# Patient Record
Sex: Male | Born: 1965 | Race: Black or African American | Hispanic: No | Marital: Single | Smoking: Current every day smoker
Health system: Southern US, Community
[De-identification: ages and names within clinical notes are randomized; demographics above are authoritative.]

---

## 2011-12-22 ENCOUNTER — Ambulatory Visit (INDEPENDENT_AMBULATORY_CARE_PROVIDER_SITE_OTHER): Payer: 59 | Admitting: Emergency Medicine

## 2011-12-22 VITALS — BP 120/74 | HR 61 | Temp 97.9°F | Resp 16 | Ht 69.5 in | Wt 192.0 lb

## 2011-12-22 DIAGNOSIS — Z0000 Encounter for general adult medical examination without abnormal findings: Secondary | ICD-10-CM

## 2011-12-22 DIAGNOSIS — Z720 Tobacco use: Secondary | ICD-10-CM

## 2011-12-22 MED ORDER — VARENICLINE TARTRATE 0.5 MG PO TABS
0.5000 mg | ORAL_TABLET | Freq: Two times a day (BID) | ORAL | Status: AC
Start: 2011-12-22 — End: 2012-03-21

## 2011-12-22 MED ORDER — VARENICLINE TARTRATE 1 MG PO TABS: 1.0000 mg | ORAL_TABLET | Freq: Two times a day (BID) | ORAL | Status: AC

## 2011-12-22 NOTE — Progress Notes (Signed)
  Subjective:    Patient ID: Jake Oconnell, male    DOB: 1966-06-16, 46 y.o.   MRN: [id number]  Other Pertinent negatives include no abdominal pain, anorexia, arthralgias, change in bowel habit, chest pain, chills, congestion, coughing, diaphoresis, fatigue, fever, headaches, joint swelling, myalgias, nausea, neck pain, numbness, rash, sore throat, swollen glands, urinary symptoms, vertigo, visual change, vomiting or weakness.      Review of Systems  Constitutional: Negative for fever, chills, diaphoresis and fatigue.  HENT: Negative.  Negative for congestion, sore throat and neck pain.   Eyes: Negative.   Respiratory: Negative.  Negative for cough.   Cardiovascular: Negative.  Negative for chest pain.  Gastrointestinal: Negative.  Negative for nausea, vomiting, abdominal pain, anorexia and change in bowel habit.  Genitourinary: Negative.   Musculoskeletal: Negative for myalgias, joint swelling and arthralgias.  Skin: Negative for rash.  Neurological: Negative for vertigo, weakness, numbness and headaches.  Hematological: Negative.   All other systems reviewed and are negative.       Objective:   Physical Exam  Nursing note and vitals reviewed. Constitutional: He is oriented to person, place, and time. He appears well-developed and well-nourished.  HENT:  Head: Normocephalic and atraumatic.  Right Ear: External ear normal.  Left Ear: External ear normal.  Nose: Nose normal.  Mouth/Throat: Oropharynx is clear and moist.  Eyes: Conjunctivae are normal. Pupils are equal, round, and reactive to light.  Neck: Normal range of motion. Neck supple. No tracheal deviation present. No thyromegaly present.  Cardiovascular: Normal rate, regular rhythm and normal heart sounds.   Pulmonary/Chest: Effort normal and breath sounds normal.  Abdominal: Soft. He exhibits no mass. There is no tenderness.  Musculoskeletal: Normal range of motion.  Lymphadenopathy:    He has no cervical adenopathy.    Neurological: He is alert and oriented to person, place, and time. He exhibits normal muscle tone. Coordination normal.  Skin: Skin is warm and dry.          Assessment & Plan:  To come in tomorrow fasting for labs.

## 2011-12-23 ENCOUNTER — Ambulatory Visit (INDEPENDENT_AMBULATORY_CARE_PROVIDER_SITE_OTHER): Payer: 59 | Admitting: Physician Assistant

## 2011-12-23 VITALS — BP 117/78 | HR 66 | Temp 97.8°F | Resp 16 | Ht 70.0 in | Wt 190.0 lb

## 2011-12-23 DIAGNOSIS — Z0000 Encounter for general adult medical examination without abnormal findings: Secondary | ICD-10-CM

## 2011-12-23 LAB — LIPID PANEL
Cholesterol: 163 mg/dL (ref 0–200)
HDL: 41 mg/dL (ref >39)
Total CHOL/HDL Ratio: 4 Ratio
Triglycerides: 194 mg/dL — ABNORMAL HIGH (ref <150)
VLDL: 39 mg/dL (ref 0–40)

## 2011-12-23 LAB — COMPREHENSIVE METABOLIC PANEL
Alkaline Phosphatase: 56 U/L (ref 39–117)
CO2: 28 mEq/L (ref 19–32)
Creat: 0.77 mg/dL (ref 0.50–1.35)
Glucose, Bld: 89 mg/dL (ref 70–99)
Sodium: 142 mEq/L (ref 135–145)
Total Bilirubin: 0.9 mg/dL (ref 0.3–1.2)
Total Protein: 7.2 g/dL (ref 6.0–8.3)

## 2011-12-23 LAB — POCT URINALYSIS DIPSTICK
Bilirubin, UA: NEGATIVE
Ketones, UA: NEGATIVE
Leukocytes, UA: NEGATIVE
Nitrite, UA: NEGATIVE
Protein, UA: NEGATIVE

## 2011-12-23 LAB — POCT CBC
Granulocyte percent: 37.7 %G (ref 37–80)
HCT, POC: 41.2 % — AB (ref 43.5–53.7)
Hemoglobin: 13.3 g/dL — AB (ref 14.1–18.1)
MPV: 9 fL (ref 0–99.8)
POC Granulocyte: 2.2 (ref 2–6.9)
POC MID %: 10.5 %M (ref 0–12)

## 2011-12-23 LAB — PSA: PSA: 0.4 ng/mL (ref <=4.00)

## 2011-12-24 LAB — VITAMIN D 25 HYDROXY (VIT D DEFICIENCY, FRACTURES): Vit D, 25-Hydroxy: 16 ng/mL — ABNORMAL LOW (ref 30–89)

## 2011-12-27 ENCOUNTER — Other Ambulatory Visit: Payer: Self-pay | Admitting: Emergency Medicine

## 2011-12-27 ENCOUNTER — Encounter: Payer: Self-pay | Admitting: Emergency Medicine

## 2011-12-27 ENCOUNTER — Telehealth: Payer: Self-pay | Admitting: *Deleted

## 2011-12-27 MED ORDER — VITAMIN D (ERGOCALCIFEROL) 1.25 MG (50000 UNIT) PO CAPS: 50000.0000 [IU] | ORAL_CAPSULE | ORAL | Status: DC

## 2012-03-30 NOTE — Progress Notes (Signed)
Lab only 

## 2013-01-04 NOTE — Telephone Encounter (Signed)
Phone call

## 2013-01-31 ENCOUNTER — Ambulatory Visit (INDEPENDENT_AMBULATORY_CARE_PROVIDER_SITE_OTHER): Payer: Commercial Managed Care - PPO | Admitting: Emergency Medicine

## 2013-01-31 VITALS — BP 116/78 | HR 56 | Temp 97.5°F | Resp 20 | Ht 69.0 in | Wt 184.6 lb

## 2013-01-31 DIAGNOSIS — R079 Chest pain, unspecified: Secondary | ICD-10-CM

## 2013-01-31 DIAGNOSIS — Z0000 Encounter for general adult medical examination without abnormal findings: Secondary | ICD-10-CM

## 2013-01-31 LAB — POCT CBC
Hemoglobin: 13.7 g/dL — AB (ref 14.1–18.1)
Lymph, poc: 2.6 (ref 0.6–3.4)
MCH, POC: 30.6 pg (ref 27–31.2)
MCHC: 31.9 g/dL (ref 31.8–35.4)
MID (cbc): 0.5 (ref 0–0.9)
MPV: 9.4 fL (ref 0–99.8)
POC Granulocyte: 1.7 — AB (ref 2–6.9)
POC MID %: 10.5 %M (ref 0–12)
Platelet Count, POC: 170 10*3/uL (ref 142–424)
RDW, POC: 12.2 %
WBC: 4.8 10*3/uL (ref 4.6–10.2)

## 2013-01-31 LAB — POCT URINALYSIS DIPSTICK
Bilirubin, UA: NEGATIVE
Blood, UA: NEGATIVE
Leukocytes, UA: NEGATIVE
Nitrite, UA: NEGATIVE
Protein, UA: NEGATIVE
Urobilinogen, UA: 0.2
pH, UA: 5.5

## 2013-01-31 MED ORDER — VITAMIN D (ERGOCALCIFEROL) 1.25 MG (50000 UNIT) PO CAPS: 50000.0000 [IU] | ORAL_CAPSULE | ORAL | Status: DC

## 2013-01-31 NOTE — Patient Instructions (Addendum)
Chest Pain (Nonspecific) °It is often hard to give a specific diagnosis for the cause of chest pain. There is always a chance that your pain could be related to something serious, such as a heart attack or a blood clot in the lungs. You need to follow up with your caregiver for further evaluation. °CAUSES  °· Heartburn. °· Pneumonia or bronchitis. °· Anxiety or stress. °· Inflammation around your heart (pericarditis) or lung (pleuritis or pleurisy). °· A blood clot in the lung. °· A collapsed lung (pneumothorax). It can develop suddenly on its own (spontaneous pneumothorax) or from injury (trauma) to the chest. °· Shingles infection (herpes zoster virus). °The chest wall is composed of bones, muscles, and cartilage. Any of these can be the source of the pain. °· The bones can be bruised by injury. °· The muscles or cartilage can be strained by coughing or overwork. °· The cartilage can be affected by inflammation and become sore (costochondritis). °DIAGNOSIS  °Lab tests or other studies, such as X-rays, electrocardiography, stress testing, or cardiac imaging, may be needed to find the cause of your pain.  °TREATMENT  °· Treatment depends on what may be causing your chest pain. Treatment may include: °· Acid blockers for heartburn. °· Anti-inflammatory medicine. °· Pain medicine for inflammatory conditions. °· Antibiotics if an infection is present. °· You may be advised to change lifestyle habits. This includes stopping smoking and avoiding alcohol, caffeine, and chocolate. °· You may be advised to keep your head raised (elevated) when sleeping. This reduces the chance of acid going backward from your stomach into your esophagus. °· Most of the time, nonspecific chest pain will improve within 2 to 3 days with rest and mild pain medicine. °HOME CARE INSTRUCTIONS  °· If antibiotics were prescribed, take your antibiotics as directed. Finish them even if you start to feel better. °· For the next few days, avoid physical  activities that bring on chest pain. Continue physical activities as directed. °· Do not smoke. °· Avoid drinking alcohol. °· Only take over-the-counter or prescription medicine for pain, discomfort, or fever as directed by your caregiver. °· Follow your caregiver's suggestions for further testing if your chest pain does not go away. °· Keep any follow-up appointments you made. If you do not go to an appointment, you could develop lasting (chronic) problems with pain. If there is any problem keeping an appointment, you must call to reschedule. °SEEK MEDICAL CARE IF:  °· You think you are having problems from the medicine you are taking. Read your medicine instructions carefully. °· Your chest pain does not go away, even after treatment. °· You develop a rash with blisters on your chest. °SEEK IMMEDIATE MEDICAL CARE IF:  °· You have increased chest pain or pain that spreads to your arm, neck, jaw, back, or abdomen. °· You develop shortness of breath, an increasing cough, or you are coughing up blood. °· You have severe back or abdominal pain, feel nauseous, or vomit. °· You develop severe weakness, fainting, or chills. °· You have a fever. °THIS IS AN EMERGENCY. Do not wait to see if the pain will go away. Get medical help at once. Call your local emergency services (911 in [place] Do not drive yourself to the hospital. °MAKE SURE YOU:  °· Understand these instructions. °· Will watch your condition. °· Will get help right away if you are not doing well or get worse. °Document Released: 04/01/2005 Document Revised: 09/14/2011 Document Reviewed: 01/26/2008 °ExitCare® Patient Information ©2014 [employer]   LL[employer]. ° °

## 2013-01-31 NOTE — Progress Notes (Addendum)
[facility] [address], [place] [address] [phone]  Date:  01/31/2013   Name:  Jake Oconnell   DOB:  23-Nov-1965   MRN:  [id number]  PCP:  No PCP Per Patient    Chief Complaint: Annual Exam, Muscle Pain, Chest Pain and Palpitations   History of Present Illness:  Jake Oconnell is a 47 y.o. very pleasant male patient who presents with the following:  Works in a warehouse.  From [place].  Has intermittent pain in chest.  Non radiating.  Sometimes lasts all day.  No associated radiation of pain, shortness of breath, nausea or vomiting. Some palpitations and sensation of a rapid heartbeat.  Chest pain lasts variable times.  No associated injury.  Pain can be short lived.  Other times has has cramps and pains in arms associated with lifting.  No back pain, weakness.  No improvement with over the counter medications or other home remedies. Denies other complaint or health concern today.   No meds or allergies.  Occasional smoker.  There are no active problems to display for this patient.   History reviewed. No pertinent past medical history.  History reviewed. No pertinent past surgical history.  History  Substance Use Topics  . Smoking status: Current Every Day Smoker  . Smokeless tobacco: Not on file  . Alcohol Use: No    History reviewed. No pertinent family history.  No Known Allergies  Medication list has been reviewed and updated.  Current Outpatient Prescriptions on File Prior to Visit  Medication Sig Dispense Refill  . ibuprofen (ADVIL,MOTRIN) 200 MG tablet Take 200 mg by mouth every 6 (six) hours as needed.      . Vitamin D, Ergocalciferol, (DRISDOL) 50000 UNITS CAPS Take 1 capsule (50,000 Units total) by mouth every 7 (seven) days.  12 capsule  3   No current facility-administered medications on file prior to visit.    Review of Systems:  As per HPI, otherwise negative.    Physical Examination: Filed Vitals:   01/31/13 1349  BP: 116/78   Pulse: 56  Temp: 97.5 F (36.4 C)  Resp: 20   Filed Vitals:   01/31/13 1349  Height: 5\' 9"  (1.753 m)  Weight: 184 lb 9.6 oz (83.734 kg)   Body mass index is 27.25 kg/(m^2). Ideal Body Weight: Weight in (lb) to have BMI = 25: 168.9  GEN: WDWN, NAD, Non-toxic, A & O x 3 HEENT: Atraumatic, Normocephalic. Neck supple. No masses, No LAD. Ears and Nose: No external deformity. CV: RRR, No M/G/R. No JVD. No thrill. No extra heart sounds. PULM: CTA B, no wheezes, crackles, rhonchi. No retractions. No resp. distress. No accessory muscle use. ABD: S, NT, ND, +BS. No rebound. No HSM. EXTR: No c/c/e NEURO Normal gait.  PSYCH: Normally interactive. Conversant. Not depressed or anxious appearing.  Calm demeanor.    Assessment and Plan: Chest pain, palpitations Muscle pain Labs Cardiology consultation   Signed,  Phillips Odor, MD   Results for orders placed in visit on 01/31/13  COMPREHENSIVE METABOLIC PANEL      Result Value Range   Sodium 141  135 - 145 mEq/L   Potassium 4.4  3.5 - 5.3 mEq/L   Chloride 105  96 - 112 mEq/L   CO2 29  19 - 32 mEq/L   Glucose, Bld 94  70 - 99 mg/dL   BUN 10  6 - 23 mg/dL   Creat 2.95  6.21 - 3.08 mg/dL   Total  Bilirubin 0.7  0.3 - 1.2 mg/dL   Alkaline Phosphatase 58  39 - 117 U/L   AST 35  0 - 37 U/L   ALT 69 (*) 0 - 53 U/L   Total Protein 7.4  6.0 - 8.3 g/dL   Albumin 4.5  3.5 - 5.2 g/dL   Calcium 9.5  8.4 - 16.1 mg/dL  LIPID PANEL      Result Value Range   Cholesterol 203 (*) 0 - 200 mg/dL   Triglycerides 096 (*) <150 mg/dL   HDL 46  >04 mg/dL   Total CHOL/HDL Ratio 4.4     VLDL 38  0 - 40 mg/dL   LDL Cholesterol 540 (*) 0 - 99 mg/dL  CK      Result Value Range   Total CK 442 (*) 7 - 232 U/L  TSH      Result Value Range   TSH 0.548  0.350 - 4.500 uIU/mL  PSA      Result Value Range   PSA 0.38  <=4.00 ng/mL  POCT CBC      Result Value Range   WBC 4.8  4.6 - 10.2 K/uL   Lymph, poc 2.6  0.6 - 3.4   POC LYMPH PERCENT 54.3 (*)  10 - 50 %L   MID (cbc) 0.5  0 - 0.9   POC MID % 10.5  0 - 12 %M   POC Granulocyte 1.7 (*) 2 - 6.9   Granulocyte percent 35.2 (*) 37 - 80 %G   RBC 4.47 (*) 4.69 - 6.13 M/uL   Hemoglobin 13.7 (*) 14.1 - 18.1 g/dL   HCT, POC 98.1 (*) 19.1 - 53.7 %   MCV 96.0  80 - 97 fL   MCH, POC 30.6  27 - 31.2 pg   MCHC 31.9  31.8 - 35.4 g/dL   RDW, POC 47.8     Platelet Count, POC 170  142 - 424 K/uL   MPV 9.4  0 - 99.8 fL  POCT URINALYSIS DIPSTICK      Result Value Range   Color, UA amber     Clarity, UA clear     Glucose, UA neg     Bilirubin, UA neg     Ketones, UA neg     Spec Grav, UA 1.025     Blood, UA neg     pH, UA 5.5     Protein, UA neg     Urobilinogen, UA 0.2     Nitrite, UA neg     Leukocytes, UA Negative

## 2013-02-01 LAB — LIPID PANEL
Cholesterol: 203 mg/dL — ABNORMAL HIGH (ref 0–200)
LDL Cholesterol: 119 mg/dL — ABNORMAL HIGH (ref 0–99)
Triglycerides: 188 mg/dL — ABNORMAL HIGH (ref <150)
VLDL: 38 mg/dL (ref 0–40)

## 2013-02-01 LAB — COMPREHENSIVE METABOLIC PANEL
ALT: 69 U/L — ABNORMAL HIGH (ref 0–53)
Albumin: 4.5 g/dL (ref 3.5–5.2)
CO2: 29 mEq/L (ref 19–32)
Glucose, Bld: 94 mg/dL (ref 70–99)
Potassium: 4.4 mEq/L (ref 3.5–5.3)
Sodium: 141 mEq/L (ref 135–145)
Total Bilirubin: 0.7 mg/dL (ref 0.3–1.2)
Total Protein: 7.4 g/dL (ref 6.0–8.3)

## 2013-02-01 LAB — PSA: PSA: 0.38 ng/mL (ref <=4.00)

## 2013-02-01 LAB — TSH: TSH: 0.548 u[IU]/mL (ref 0.350–4.500)

## 2013-02-01 NOTE — Addendum Note (Signed)
Addended by: Carmelina Dane on: 02/01/2013 07:38 PM   Modules accepted: Orders

## 2013-02-07 ENCOUNTER — Ambulatory Visit (INDEPENDENT_AMBULATORY_CARE_PROVIDER_SITE_OTHER): Payer: Commercial Managed Care - PPO | Admitting: Emergency Medicine

## 2013-02-07 VITALS — BP 108/88 | HR 67 | Temp 98.2°F | Resp 16 | Ht 70.0 in | Wt 190.0 lb

## 2013-02-07 DIAGNOSIS — M6282 Rhabdomyolysis: Secondary | ICD-10-CM

## 2013-02-07 DIAGNOSIS — R079 Chest pain, unspecified: Secondary | ICD-10-CM

## 2013-02-07 DIAGNOSIS — Z0000 Encounter for general adult medical examination without abnormal findings: Secondary | ICD-10-CM

## 2013-02-07 LAB — CK: Total CK: 234 U/L — ABNORMAL HIGH (ref 7–232)

## 2013-02-07 NOTE — Progress Notes (Signed)
[facility] [address] [place] [address] [phone]  Date:  02/07/2013   Name:  Jake Oconnell   DOB:  [birth year]   MRN:  [id number]  PCP:  No PCP Per Patient    Chief Complaint: Follow-up   History of Present Illness:  Jake Oconnell is a 47 y.o. very pleasant male patient who presents with the following:  Seen previously with generalized muscle pains.  Had chest pain associated with the general muscle pain.  No history of injury.   Had elevated CK.  Now says pains gone and feels normal and well.  Work is going well. No recurrent pains.  Denies other complaint or health concern today.   There are no active problems to display for this patient.   No past medical history on file.  No past surgical history on file.  History  Substance Use Topics  . Smoking status: Current Every Day Smoker  . Smokeless tobacco: Not on file  . Alcohol Use: No    No family history on file.  No Known Allergies  Medication list has been reviewed and updated.  Current Outpatient Prescriptions on File Prior to Visit  Medication Sig Dispense Refill  . Vitamin D, Ergocalciferol, (DRISDOL) 50000 UNITS CAPS Take 1 capsule (50,000 Units total) by mouth every 7 (seven) days.  12 capsule  3  . ibuprofen (ADVIL,MOTRIN) 200 MG tablet Take 200 mg by mouth every 6 (six) hours as needed.       No current facility-administered medications on file prior to visit.    Review of Systems:  As per HPI, otherwise negative.    Physical Examination: Filed Vitals:   02/07/13 0847  BP: 108/88  Pulse: 67  Temp: 98.2 F (36.8 C)  Resp: 16   Filed Vitals:   02/07/13 0847  Height: 5\' 10"  (1.778 m)  Weight: 190 lb (86.183 kg)   Body mass index is 27.26 kg/(m^2). Ideal Body Weight: Weight in (lb) to have BMI = 25: 173.9  GEN: WDWN, NAD, Non-toxic, A & O x 3 HEENT: Atraumatic, Normocephalic. Neck supple. No masses, No LAD. Ears and Nose: No external deformity. CV: RRR, No  M/G/R. No JVD. No thrill. No extra heart sounds. PULM: CTA B, no wheezes, crackles, rhonchi. No retractions. No resp. distress. No accessory muscle use. ABD: S, NT, ND, +BS. No rebound. No HSM. EXTR: No c/c/e NEURO Normal gait.  PSYCH: Normally interactive. Conversant. Not depressed or anxious appearing.  Calm demeanor.    Assessment and Plan: Rhabdomyolysis Increase fluids   Signed,  Phillips Odor, MD

## 2013-03-27 ENCOUNTER — Ambulatory Visit: Payer: Self-pay | Admitting: Internal Medicine

## 2013-05-09 ENCOUNTER — Telehealth: Payer: Self-pay

## 2013-05-09 DIAGNOSIS — R748 Abnormal levels of other serum enzymes: Secondary | ICD-10-CM

## 2013-05-09 NOTE — Telephone Encounter (Signed)
PATIENT STATES HE HAS NEVER HEARD ANYTHING BACK FROM HIS PHYSICAL. HE WOULD LIKE TO GET SOME INFORMATION. HE HAS NOT EVER RECEIVED HIS LAB RESULTS EITHER. BEST PHONE [phone] (CELL)   WILL LET us  KNOW THE NAME OF THE PHARMACY WHEN HE IS CALLED BACK.  MBC

## 2013-05-10 NOTE — Telephone Encounter (Signed)
Will you advise on CK? Was elevated, came for recheck, better, but still elevated.

## 2013-05-11 NOTE — Telephone Encounter (Signed)
Labs look good.  Needs to moderate his workouts.  Follow up in one month for repeat CK and CMP

## 2013-05-11 NOTE — Telephone Encounter (Signed)
Called patient to advise, he will come back in 1 month for lab only

## 2013-05-11 NOTE — Telephone Encounter (Signed)
Thanks called left message for him to call me back.

## 2013-07-03 ENCOUNTER — Ambulatory Visit (INDEPENDENT_AMBULATORY_CARE_PROVIDER_SITE_OTHER): Payer: Commercial Managed Care - PPO | Admitting: Family Medicine

## 2013-07-03 VITALS — BP 120/76 | HR 56 | Temp 98.0°F | Resp 16 | Ht 69.0 in | Wt 186.0 lb

## 2013-07-03 DIAGNOSIS — R899 Unspecified abnormal finding in specimens from other organs, systems and tissues: Secondary | ICD-10-CM

## 2013-07-03 DIAGNOSIS — Z23 Encounter for immunization: Secondary | ICD-10-CM

## 2013-07-03 DIAGNOSIS — R6889 Other general symptoms and signs: Secondary | ICD-10-CM

## 2013-07-03 DIAGNOSIS — E559 Vitamin D deficiency, unspecified: Secondary | ICD-10-CM

## 2013-07-03 DIAGNOSIS — R748 Abnormal levels of other serum enzymes: Secondary | ICD-10-CM

## 2013-07-03 LAB — POCT CBC
Granulocyte percent: 38.9 %G (ref 37–80)
HCT, POC: 40.8 % — AB (ref 43.5–53.7)
Hemoglobin: 12.7 g/dL — AB (ref 14.1–18.1)
MCV: 96.7 fL (ref 80–97)
MPV: 9.8 fL (ref 0–99.8)
POC LYMPH PERCENT: 50.7 %L — AB (ref 10–50)
POC MID %: 10.4 %M (ref 0–12)
RBC: 4.22 M/uL — AB (ref 4.69–6.13)
RDW, POC: 12 %

## 2013-07-03 LAB — HEPATIC FUNCTION PANEL
ALT: 24 U/L (ref 0–53)
AST: 23 U/L (ref 0–37)
Alkaline Phosphatase: 56 U/L (ref 39–117)
Bilirubin, Direct: 0.1 mg/dL (ref 0.0–0.3)
Indirect Bilirubin: 0.4 mg/dL (ref 0.0–0.9)

## 2013-07-03 LAB — CK: Total CK: 224 U/L (ref 7–232)

## 2013-07-03 NOTE — Progress Notes (Addendum)
[facility] [address] [place] [address] [phone]  Date:  07/03/2013   Name:  Jake Oconnell   DOB:  05-02-1966   MRN:  [id number]  PCP:  No PCP Per Patient    Chief Complaint: Follow-up   History of Present Illness:  Jake Oconnell is a 47 y.o. very pleasant male patient who presents with the following:  He is here today for follow-up.   He is feeling well.  He was noted to have rhabdo over the summer and would like to follow-up today- he notes that his muscle pains have now gone away.    He is fasting today for labs.    His last tetanus shot was more than 10 years ago.  He would like to have a tetanus shot today.    He is generally health PSA done in July- normal  There are no active problems to display for this patient.   History reviewed. No pertinent past medical history.  History reviewed. No pertinent past surgical history.  History  Substance Use Topics  . Smoking status: Current Every Day Smoker  . Smokeless tobacco: Not on file  . Alcohol Use: No    History reviewed. No pertinent family history.  No Known Allergies  Medication list has been reviewed and updated.  Current Outpatient Prescriptions on File Prior to Visit  Medication Sig Dispense Refill  . ibuprofen (ADVIL,MOTRIN) 200 MG tablet Take 200 mg by mouth every 6 (six) hours as needed.      . Vitamin D, Ergocalciferol, (DRISDOL) 50000 UNITS CAPS Take 1 capsule (50,000 Units total) by mouth every 7 (seven) days.  12 capsule  3   No current facility-administered medications on file prior to visit.    Review of Systems:  As per HPI- otherwise negative.   Physical Examination: Filed Vitals:   07/03/13 0908  BP: 120/76  Pulse: 56  Temp: 98 F (36.7 C)  Resp: 16   Filed Vitals:   07/03/13 0908  Height: 5\' 9"  (1.753 m)  Weight: 186 lb (84.369 kg)   Body mass index is 27.45 kg/(m^2). Ideal Body Weight: Weight in (lb) to have BMI = 25: 168.9  GEN: WDWN,  NAD, Non-toxic, A & O x 3, looks well HEENT: Atraumatic, Normocephalic. Neck supple. No masses, No LAD.  Bilateral TM wnl, oropharynx normal.  PEERL,EOMI.   Ears and Nose: No external deformity. CV: RRR, No M/G/R. No JVD. No thrill. No extra heart sounds. PULM: CTA B, no wheezes, crackles, rhonchi. No retractions. No resp. distress. No accessory muscle use. ABD: S, NT, ND EXTR: No c/c/e NEURO Normal gait.  PSYCH: Normally interactive. Conversant. Not depressed or anxious appearing.  Calm demeanor.  Gu: normal testicular exam, no penile lesions or discharge Prostate normal  Assessment and Plan: Immunization due  Elevated CK - Plan: CK  Abnormal laboratory test - Plan: POCT CBC, Hepatic Function Panel  Unspecified vitamin D deficiency - Plan: Vitamin D, 25-hydroxy  Update tdap today- declined flu shot Otherwise await labs. He is not sure if he needs to continue taking his vit D- will await his repeat level and decide  Signed Abbe Amsterdam, MD  07/06/12: called to go over labs.  No answer- will send letter instead Results for orders placed in visit on 07/03/13  CK      Result Value Range   Total CK 224  7 - 232 U/L  VITAMIN D 25 HYDROXY      Result Value  Range   Vit D, 25-Hydroxy 15 (*) 30 - 89 ng/mL  HEPATIC FUNCTION PANEL      Result Value Range   Total Bilirubin 0.5  0.3 - 1.2 mg/dL   Bilirubin, Direct 0.1  0.0 - 0.3 mg/dL   Indirect Bilirubin 0.4  0.0 - 0.9 mg/dL   Alkaline Phosphatase 56  39 - 117 U/L   AST 23  0 - 37 U/L   ALT 24  0 - 53 U/L   Total Protein 6.8  6.0 - 8.3 g/dL   Albumin 4.2  3.5 - 5.2 g/dL  POCT CBC      Result Value Range   WBC 5.0  4.6 - 10.2 K/uL   Lymph, poc 2.5  0.6 - 3.4   POC LYMPH PERCENT 50.7 (*) 10 - 50 %L   MID (cbc) 0.5  0 - 0.9   POC MID % 10.4  0 - 12 %M   POC Granulocyte 1.9 (*) 2 - 6.9   Granulocyte percent 38.9  37 - 80 %G   RBC 4.22 (*) 4.69 - 6.13 M/uL   Hemoglobin 12.7 (*) 14.1 - 18.1 g/dL   HCT, POC 40.9 (*) 81.1 - 53.7  %   MCV 96.7  80 - 97 fL   MCH, POC 30.1  27 - 31.2 pg   MCHC 31.1 (*) 31.8 - 35.4 g/dL   RDW, POC 91.4     Platelet Count, POC 177  142 - 424 K/uL   MPV 9.8  0 - 99.8 fL

## 2013-07-03 NOTE — Patient Instructions (Signed)
I will be in touch regarding your labs.   Take care!

## 2013-07-04 LAB — VITAMIN D 25 HYDROXY (VIT D DEFICIENCY, FRACTURES): Vit D, 25-Hydroxy: 15 ng/mL — ABNORMAL LOW (ref 30–89)

## 2013-07-06 MED ORDER — VITAMIN D (ERGOCALCIFEROL) 1.25 MG (50000 UNIT) PO CAPS: 50000.0000 [IU] | ORAL_CAPSULE | ORAL | Status: DC

## 2013-07-06 NOTE — Addendum Note (Signed)
Addended by: Abbe AmsterdamOPLAND, Raidyn Wassink C on: 07/06/2013 03:26 PM   Modules accepted: Orders

## 2017-10-05 ENCOUNTER — Ambulatory Visit (INDEPENDENT_AMBULATORY_CARE_PROVIDER_SITE_OTHER): Payer: Commercial Managed Care - PPO

## 2017-10-05 ENCOUNTER — Ambulatory Visit: Payer: Commercial Managed Care - PPO | Admitting: Urgent Care

## 2017-10-05 ENCOUNTER — Encounter: Payer: Self-pay | Admitting: Urgent Care

## 2017-10-05 VITALS — BP 123/75 | HR 80 | Temp 98.4°F | Resp 17 | Ht 69.0 in | Wt 190.0 lb

## 2017-10-05 DIAGNOSIS — L97519 Non-pressure chronic ulcer of other part of right foot with unspecified severity: Secondary | ICD-10-CM

## 2017-10-05 DIAGNOSIS — M79671 Pain in right foot: Secondary | ICD-10-CM

## 2017-10-05 DIAGNOSIS — L089 Local infection of the skin and subcutaneous tissue, unspecified: Secondary | ICD-10-CM | POA: Diagnosis not present

## 2017-10-05 LAB — POCT CBC
Granulocyte percent: 46.4 %G (ref 37–80)
HCT, POC: 39.3 % — AB (ref 43.5–53.7)
HEMOGLOBIN: 12.9 g/dL — AB (ref 14.1–18.1)
LYMPH, POC: 3.5 — AB (ref 0.6–3.4)
MCH: 29.9 pg (ref 27–31.2)
MCHC: 32.9 g/dL (ref 31.8–35.4)
MCV: 90.9 fL (ref 80–97)
MID (cbc): 0.4 (ref 0–0.9)
MPV: 8.3 fL (ref 0–99.8)
POC Granulocyte: 3.3 (ref 2–6.9)
POC LYMPH PERCENT: 48.1 %L (ref 10–50)
POC MID %: 5.5 % (ref 0–12)
Platelet Count, POC: 206 10*3/uL (ref 142–424)
RBC: 4.32 M/uL — AB (ref 4.69–6.13)
RDW, POC: 12.3 %
WBC: 7.2 10*3/uL (ref 4.6–10.2)

## 2017-10-05 LAB — POCT GLYCOSYLATED HEMOGLOBIN (HGB A1C): Hemoglobin A1C: 4.9

## 2017-10-05 MED ORDER — DOXYCYCLINE HYCLATE 100 MG PO CAPS: 100.0000 mg | ORAL_CAPSULE | Freq: Two times a day (BID) | ORAL | 0 refills | Status: DC

## 2017-10-05 MED ORDER — CEFTRIAXONE SODIUM 1 G IJ SOLR
1.0000 g | Freq: Once | INTRAMUSCULAR | Status: AC
  Administered 2017-10-05: 1 g via INTRAMUSCULAR

## 2017-10-05 MED ORDER — FLUCONAZOLE 150 MG PO TABS: 150.0000 mg | ORAL_TABLET | ORAL | 0 refills | Status: DC

## 2017-10-05 NOTE — Progress Notes (Signed)
   MRN: [id number] DOB: 01/31/1966  Subjective:   Jake Oconnell is a 52 y.o. male presenting for 1 week history of worsening right foot [id number] over an open wound. Reports other wounds are developing over plantar surface of his foot, has itching. Has tried ibuprofen for his pain with some relief. Does not walk on perpetually wet floors. However, he does wear tight shoes, sweats with his feet very much, also walks barefoot frequently. Denies history of diabetes, frequent skin infections.   Jake Oconnell is not currently taking any medications and has No Known Allergies.  Jake Oconnell denies past medical and surgical history.   Objective:   Vitals: BP 123/75   Pulse 80   Temp 98.4 F (36.9 C) (Oral)   Resp 17   Ht 5\' 9"  (1.753 m)   Wt 190 lb (86.2 kg)   SpO2 98%   BMI 28.06 kg/m   Physical Exam  Constitutional: He is oriented to person, place, and time. He appears well-developed and well-nourished.  Cardiovascular: Normal rate.  Pulmonary/Chest: Effort normal.  Neurological: He is alert and oriented to person, place, and time.   Dg Foot Complete Right  Result Date: 10/05/2017 CLINICAL DATA:  Right foot ulcer EXAM: RIGHT FOOT COMPLETE - 3+ VIEW COMPARISON:  None. FINDINGS: There is no evidence of fracture or dislocation. There is no evidence of arthropathy or other focal bone abnormality. Soft tissues are unremarkable. IMPRESSION: No acute abnormality noted. Electronically Signed   By: Alcide CleverMark  Lukens M.D.   On: 10/05/2017 16:53   Results for orders placed or performed in visit on 10/05/17 (from the past 24 hour(s))  POCT CBC     Status: Abnormal   Collection Time: 10/05/17  5:06 PM  Result Value Ref Range   WBC 7.2 4.6 - 10.2 K/uL   Lymph, poc 3.5 (A) 0.6 - 3.4   POC LYMPH PERCENT 48.1 10 - 50 %L   MID (cbc) 0.4 0 - 0.9   POC MID % 5.5 0 - 12 %M   POC Granulocyte 3.3 2 - 6.9   Granulocyte percent 46.4 37 - 80 %G   RBC 4.32 (A) 4.69 - 6.13 M/uL   Hemoglobin 12.9 (A) 14.1 - 18.1 g/dL   HCT, POC 72.539.3  (A) 36.643.5 - 53.7 %   MCV 90.9 80 - 97 fL   MCH, POC 29.9 27 - 31.2 pg   MCHC 32.9 31.8 - 35.4 g/dL   RDW, POC 44.012.3 %   Platelet Count, POC 206 142 - 424 K/uL   MPV 8.3 0 - 99.8 fL  POCT glycosylated hemoglobin (Hb A1C)     Status: None   Collection Time: 10/05/17  5:10 PM  Result Value Ref Range   Hemoglobin A1C 4.9    Assessment and Plan :   Right foot infection - Plan: WOUND CULTURE, DG Foot Complete Right, cefTRIAXone (ROCEPHIN) injection 1 g, POCT CBC, POCT glycosylated hemoglobin (Hb A1C), Comprehensive metabolic panel  Right foot pain  Case precepted with Dr. Alvy BimlerSagardia. IM Ceftriaxone in clinic. Start doxycycline and diflucan. Counseled patient on potential for adverse effects with medications prescribed today, patient verbalized understanding. Return-to-clinic precautions discussed, patient verbalized understanding. Otherwise, follow up in 2 days.  Wallis BambergMario Annajulia Lewing, PA-C [facility] [phone] 10/05/2017  4:24 PM

## 2017-10-05 NOTE — Patient Instructions (Addendum)
Keep your foot dry, elevate your foot. You may take 500mg  Tylenol with ibuprofen 400-600mg  every 6 hours for pain and inflammation.     IF you received an x-ray today, you will receive an invoice from [facility]. Please contact [facility] at [phone] with questions or concerns regarding your invoice.   IF you received labwork today, you will receive an invoice from [facility]. Please contact [facility] at [phone] with questions or concerns regarding your invoice.   Our billing staff will not be able to assist you with questions regarding bills from these companies.  You will be contacted with the lab results as soon as they are available. The fastest way to get your results is to activate your My Chart account. Instructions are located on the last page of this paperwork. If you have not heard from us regarding the results in 2 weeks, please contact this office.

## 2017-10-06 LAB — COMPREHENSIVE METABOLIC PANEL
ALBUMIN: 4.4 g/dL (ref 3.5–5.5)
ALK PHOS: 74 IU/L (ref 39–117)
ALT: 26 IU/L (ref 0–44)
AST: 22 IU/L (ref 0–40)
Albumin/Globulin Ratio: 1.5 (ref 1.2–2.2)
BILIRUBIN TOTAL: 0.2 mg/dL (ref 0.0–1.2)
BUN / CREAT RATIO: 15 (ref 9–20)
BUN: 12 mg/dL (ref 6–24)
CO2: 21 mmol/L (ref 20–29)
CREATININE: 0.82 mg/dL (ref 0.76–1.27)
Calcium: 9.4 mg/dL (ref 8.7–10.2)
Chloride: 106 mmol/L (ref 96–106)
GFR calc non Af Amer: 102 mL/min/{1.73_m2} (ref >59)
GFR, EST AFRICAN AMERICAN: 118 mL/min/{1.73_m2} (ref >59)
GLOBULIN, TOTAL: 2.9 g/dL (ref 1.5–4.5)
GLUCOSE: 88 mg/dL (ref 65–99)
Potassium: 4.4 mmol/L (ref 3.5–5.2)
SODIUM: 143 mmol/L (ref 134–144)
TOTAL PROTEIN: 7.3 g/dL (ref 6.0–8.5)

## 2017-10-07 ENCOUNTER — Encounter: Payer: Self-pay | Admitting: Urgent Care

## 2017-10-07 ENCOUNTER — Ambulatory Visit: Payer: Commercial Managed Care - PPO | Admitting: Urgent Care

## 2017-10-07 VITALS — BP 111/75 | HR 88 | Temp 98.4°F | Resp 18 | Ht 69.0 in | Wt 190.0 lb

## 2017-10-07 DIAGNOSIS — M79671 Pain in right foot: Secondary | ICD-10-CM

## 2017-10-07 DIAGNOSIS — Z1211 Encounter for screening for malignant neoplasm of colon: Secondary | ICD-10-CM

## 2017-10-07 DIAGNOSIS — L089 Local infection of the skin and subcutaneous tissue, unspecified: Secondary | ICD-10-CM

## 2017-10-07 MED ORDER — FLUCONAZOLE 150 MG PO TABS: 150.0000 mg | ORAL_TABLET | ORAL | 0 refills | Status: DC

## 2017-10-07 NOTE — Progress Notes (Signed)
    MRN: [id number] DOB: 03/01/1966  Subjective:   Jake Oconnell is a 52 y.o. male presenting for follow up on right foot infection. Patient was last seen on 10/05/2017, was started on doxycycline and diflucan. Today, reports significant improvement in his pain, drainage. Still has some mild pain but is overall much better. Denies fever, red streaks. He is tolerating the medications well. Patient is due for colonoscopy, is okay with getting this scheduled.    Jake Oconnell has a current medication list which includes the following prescription(s): doxycycline and fluconazole. Also has No Known Allergies.  Jake Oconnell  has no past medical history on file. Also  has no past surgical history on file.  Objective:   Vitals: BP 111/75   Pulse 88   Temp 98.4 F (36.9 C) (Oral)   Resp 18   Ht 5\' 9"  (1.753 m)   Wt 190 lb (86.2 kg)   SpO2 97%   BMI 28.06 kg/m   Physical Exam  Constitutional: He is oriented to person, place, and time. He appears well-developed and well-nourished.  Cardiovascular: Normal rate.  Pulmonary/Chest: Effort normal.  Neurological: He is alert and oriented to person, place, and time.  Skin:       Assessment and Plan :   Right foot infection  Right foot pain  Colon cancer screening - Plan: Ambulatory referral to Gastroenterology  Improved, continue doxycycline and diflucan to completion. Referral for colonoscopy is pending.   Wallis BambergMario Chandlar Guice, PA-C [facility] [phone] 10/07/2017 10:07 AM

## 2017-10-07 NOTE — Patient Instructions (Addendum)
You may take 500mg  Tylenol with ibuprofen 400-600mg  every 6 hours for pain and inflammation. You can use warm compresses to the right foot for 20 minutes 3 times a day. Make sure you finish your antibiotic course.      Colonoscopy, Adult A colonoscopy is an exam to look at the entire large intestine. During the exam, a lubricated, bendable tube is inserted into the anus and then passed into the rectum, colon, and other parts of the large intestine. A colonoscopy is often done as a part of normal colorectal screening or in response to certain symptoms, such as anemia, persistent diarrhea, abdominal pain, and blood in the stool. The exam can help screen for and diagnose medical problems, including:  Tumors.  Polyps.  Inflammation.  Areas of bleeding.  Tell a health care provider about:  Any allergies you have.  All medicines you are taking, including vitamins, herbs, eye drops, creams, and over-the-counter medicines.  Any problems you or family members have had with anesthetic medicines.  Any blood disorders you have.  Any surgeries you have had.  Any medical conditions you have.  Any problems you have had passing stool. What are the risks? Generally, this is a safe procedure. However, problems may occur, including:  Bleeding.  A tear in the intestine.  A reaction to medicines given during the exam.  Infection (rare).  What happens before the procedure? Eating and drinking restrictions Follow instructions from your health care provider about eating and drinking, which may include:  A few days before the procedure - follow a low-fiber diet. Avoid nuts, seeds, dried fruit, raw fruits, and vegetables.  1-3 days before the procedure - follow a clear liquid diet. Drink only clear liquids, such as clear broth or bouillon, black coffee or tea, clear juice, clear soft drinks or sports drinks, gelatin dessert, and popsicles. Avoid any liquids that contain red or purple  dye.  On the day of the procedure - do not eat or drink anything during the 2 hours before the procedure, or within the time period that your health care provider recommends.  Bowel prep If you were prescribed an oral bowel prep to clean out your colon:  Take it as told by your health care provider. Starting the day before your procedure, you will need to drink a large amount of medicated liquid. The liquid will cause you to have multiple loose stools until your stool is almost clear or light green.  If your skin or anus gets irritated from diarrhea, you may use these to relieve the irritation: ? Medicated wipes, such as adult wet wipes with aloe and vitamin E. ? A skin soothing-product like petroleum jelly.  If you vomit while drinking the bowel prep, take a break for up to 60 minutes and then begin the bowel prep again. If vomiting continues and you cannot take the bowel prep without vomiting, call your health care provider.  General instructions  Ask your health care provider about changing or stopping your regular medicines. This is especially important if you are taking diabetes medicines or blood thinners.  Plan to have someone take you home from the hospital or clinic. What happens during the procedure?  An IV tube may be inserted into one of your veins.  You will be given medicine to help you relax (sedative).  To reduce your risk of infection: ? Your health care team will wash or sanitize their hands. ? Your anal area will be washed with soap.  You will  be asked to lie on your side with your knees bent.  Your health care provider will lubricate a long, thin, flexible tube. The tube will have a camera and a light on the end.  The tube will be inserted into your anus.  The tube will be gently eased through your rectum and colon.  Air will be delivered into your colon to keep it open. You may feel some pressure or cramping.  The camera will be used to take images during  the procedure.  A small tissue sample may be removed from your body to be examined under a microscope (biopsy). If any potential problems are found, the tissue will be sent to a lab for testing.  If small polyps are found, your health care provider may remove them and have them checked for cancer cells.  The tube that was inserted into your anus will be slowly removed. The procedure may vary among health care providers and hospitals. What happens after the procedure?  Your blood pressure, heart rate, breathing rate, and blood oxygen level will be monitored until the medicines you were given have worn off.  Do not drive for 24 hours after the exam.  You may have a small amount of blood in your stool.  You may pass gas and have mild abdominal cramping or bloating due to the air that was used to inflate your colon during the exam.  It is up to you to get the results of your procedure. Ask your health care provider, or the department performing the procedure, when your results will be ready. This information is not intended to replace advice given to you by your health care provider. Make sure you discuss any questions you have with your health care provider. Document Released: 06/19/2000 Document Revised: 04/22/2016 Document Reviewed: 09/03/2015 Elsevier Interactive Patient Education  2018 [employer]     IF you received an x-ray today, you will receive an invoice from [facility]. Please contact [facility] at [phone] with questions or concerns regarding your invoice.   IF you received labwork today, you will receive an invoice from [facility]. Please contact [facility] at [phone] with questions or concerns regarding your invoice.   Our billing staff will not be able to assist you with questions regarding bills from these companies.  You will be contacted with the lab results as soon as they are available. The fastest way to get your results is to activate  your My Chart account. Instructions are located on the last page of this paperwork. If you have not heard from us regarding the results in 2 weeks, please contact this office.

## 2017-10-08 LAB — WOUND CULTURE

## 2017-10-13 ENCOUNTER — Encounter: Payer: Self-pay | Admitting: Gastroenterology

## 2017-10-25 ENCOUNTER — Encounter: Payer: Self-pay | Admitting: Gastroenterology

## 2017-10-25 ENCOUNTER — Other Ambulatory Visit: Payer: Self-pay

## 2017-10-25 ENCOUNTER — Ambulatory Visit (AMBULATORY_SURGERY_CENTER): Payer: Self-pay

## 2017-10-25 VITALS — Ht 69.0 in | Wt 196.0 lb

## 2017-10-25 DIAGNOSIS — Z1211 Encounter for screening for malignant neoplasm of colon: Secondary | ICD-10-CM

## 2017-10-25 MED ORDER — NA SULFATE-K SULFATE-MG SULF 17.5-3.13-1.6 GM/177ML PO SOLN: 1.0000 | Freq: Once | ORAL | 0 refills | Status: AC

## 2017-10-25 NOTE — Progress Notes (Signed)
No egg or soy allergy known to patient  Pt denies not  having any procedures  Or surgery No diet pills per patient No home 02 use per patient  No blood thinners per patient  Pt denies issues with constipation  No A fib or A flutter  EMMI video sent to pt's e mail , pt does not have email

## 2017-11-05 ENCOUNTER — Encounter: Payer: Self-pay | Admitting: Urgent Care

## 2017-11-05 ENCOUNTER — Ambulatory Visit: Payer: Commercial Managed Care - PPO | Admitting: Urgent Care

## 2017-11-05 VITALS — BP 126/82 | HR 84 | Temp 97.6°F | Resp 18 | Ht 69.0 in | Wt 191.0 lb

## 2017-11-05 DIAGNOSIS — L089 Local infection of the skin and subcutaneous tissue, unspecified: Secondary | ICD-10-CM

## 2017-11-05 DIAGNOSIS — L24A9 Irritant contact dermatitis due friction or contact with other specified body fluids: Secondary | ICD-10-CM

## 2017-11-05 DIAGNOSIS — T148XXA Other injury of unspecified body region, initial encounter: Secondary | ICD-10-CM

## 2017-11-05 DIAGNOSIS — M7989 Other specified soft tissue disorders: Secondary | ICD-10-CM | POA: Diagnosis not present

## 2017-11-05 MED ORDER — PREDNISONE 20 MG PO TABS: ORAL_TABLET | ORAL | 0 refills | Status: DC

## 2017-11-05 MED ORDER — SULFAMETHOXAZOLE-TRIMETHOPRIM 800-160 MG PO TABS: 1.0000 | ORAL_TABLET | Freq: Two times a day (BID) | ORAL | 0 refills | Status: DC

## 2017-11-05 NOTE — Progress Notes (Signed)
    MRN: [id number] DOB: December 05, 1965  Subjective:   Jake Oconnell is a 52 y.o. male presenting for 1 week history of recurrent infection of his right foot.  Patient was last seen in early April for the same.  He was treated with doxycycline and Diflucan successfully.  Patient's symptoms resolved but returned in a week.  He reports that he has not kept his feet and moist environments.  He says that he also changed his shoes that he most commonly used.  Denies fever, red streaks, swelling of his lower legs, nausea, vomiting, chills.  He states that the only inciting factor he can recall is that prior to his foot infection he had gone to get his feet cleaned for the first time in his life and he has since had issues with his feet.  Cordera has a current medication list which includes the following prescription(s): ibuprofen. Also has No Known Allergies.  Tennis  has no past medical history on file. Also  has no past surgical history on file.  Objective:   Vitals: BP 126/82   Pulse 84   Temp 97.6 F (36.4 C) (Oral)   Resp 18   Ht  (1.753 m)   Wt 191 lb (86.6 kg)   SpO2 96%   BMI 28.21 kg/m   Physical Exam  Constitutional: He is oriented to person, place, and time. He appears well-developed and well-nourished.  Cardiovascular: Normal rate.  Pulmonary/Chest: Effort normal.  Neurological: He is alert and oriented to person, place, and time.  Skin:       Assessment and Plan :   Right foot infection - Plan: sulfamethoxazole-trimethoprim (BACTRIM DS,SEPTRA DS) 800-160 MG tablet, HIV antibody, CBC, Uric A+ANA+RA Qn+CRP+ASO, Sedimentation Rate, WOUND CULTURE, RPR, RPR  Drainage from wound - Plan: sulfamethoxazole-trimethoprim (BACTRIM DS,SEPTRA DS) 800-160 MG tablet, Uric A+ANA+RA Qn+CRP+ASO, Sedimentation Rate, WOUND CULTURE, RPR  Left foot infection - Plan: sulfamethoxazole-trimethoprim (BACTRIM DS,SEPTRA DS) 800-160 MG tablet, HIV antibody, CBC, Uric A+ANA+RA Qn+CRP+ASO, Sedimentation  Rate, WOUND CULTURE, RPR, RPR  Swelling of right foot - Plan: predniSONE (DELTASONE) 20 MG tablet  Recommended patient change his shoes out given that he does not keep them in moist environments.  He does practice good hygiene at home.  I will attempt a full work-up with labs.  Wound culture is pending but we will start with Bactrim for his foot infection and prednisone for the plaque-like lesions of his foot.  Patient is to follow-up in 1 week.  Wallis Bamberg, PA-C Primary Care at [facility] [phone] 11/05/2017  4:18 PM

## 2017-11-05 NOTE — Patient Instructions (Addendum)
Wound Care, Adult Taking care of your wound properly can help to prevent pain and infection. It can also help your wound to heal more quickly. How is this treated? Wound care  Follow instructions from your health care provider about how to take care of your wound. Make sure you: ? Wash your hands with soap and water before you change the bandage (dressing). If soap and water are not available, use hand sanitizer. ? Change your dressing as told by your health care provider. ? Leave stitches (sutures), skin glue, or adhesive strips in place. These skin closures may need to stay in place for 2 weeks or longer. If adhesive strip edges start to loosen and curl up, you may trim the loose edges. Do not remove adhesive strips completely unless your health care provider tells you to do that.  Check your wound area every day for signs of infection. Check for: ? More redness, swelling, or pain. ? More fluid or blood. ? Warmth. ? Pus or a bad smell.  Ask your health care provider if you should clean the wound with mild soap and water. Doing this may include: ? Using a clean towel to pat the wound dry after cleaning it. Do not rub or scrub the wound. ? Applying a cream or ointment. Do this only as told by your health care provider. ? Covering the incision with a clean dressing.  Ask your health care provider when you can leave the wound uncovered. Medicines   If you were prescribed an antibiotic medicine, cream, or ointment, take or use the antibiotic as told by your health care provider. Do not stop taking or using the antibiotic even if your condition improves.  Take over-the-counter and prescription medicines only as told by your health care provider. If you were prescribed pain medicine, take it at least 30 minutes before doing any wound care or as told by your health care provider. General instructions  Return to your normal activities as told by your health care provider. Ask your health care  provider what activities are safe.  Do not scratch or pick at the wound.  Keep all follow-up visits as told by your health care provider. This is important.  Eat a diet that includes protein, vitamin A, vitamin C, and other nutrient-rich foods. These help the wound heal: ? Protein-rich foods include meat, dairy, beans, nuts, and other sources. ? Vitamin A-rich foods include carrots and dark green, leafy vegetables. ? Vitamin C-rich foods include citrus, tomatoes, and other fruits and vegetables. ? Nutrient-rich foods have protein, carbohydrates, fat, vitamins, or minerals. Eat a variety of healthy foods including vegetables, fruits, and whole grains. Contact a health care provider if:  You received a tetanus shot and you have swelling, severe pain, redness, or bleeding at the injection site.  Your pain is not controlled with medicine.  You have more redness, swelling, or pain around the wound.  You have more fluid or blood coming from the wound.  Your wound feels warm to the touch.  You have pus or a bad smell coming from the wound.  You have a fever or chills.  You are nauseous or you vomit.  You are dizzy. Get help right away if:  You have a red streak going away from your wound.  The edges of the wound open up and separate.  Your wound is bleeding and the bleeding does not stop with gentle pressure.  You have a rash.  You faint.  You have trouble   breathing. This information is not intended to replace advice given to you by your health care provider. Make sure you discuss any questions you have with your health care provider. Document Released: 03/31/2008 Document Revised: 02/19/2016 Document Reviewed: 01/07/2016 Elsevier Interactive Patient Education  2017 [employer] Inc.       IF you received an x-ray today, you will receive an invoice from [facility]. Please contact [facility] at [phone] with questions or concerns regarding your  invoice.   IF you received labwork today, you will receive an invoice from [facility]. Please contact [facility] at [phone] with questions or concerns regarding your invoice.   Our billing staff will not be able to assist you with questions regarding bills from these companies.  You will be contacted with the lab results as soon as they are available. The fastest way to get your results is to activate your My Chart account. Instructions are located on the last page of this paperwork. If you have not heard from us regarding the results in 2 weeks, please contact this office.      

## 2017-11-06 LAB — CBC
Hematocrit: 37.8 % (ref 37.5–51.0)
Hemoglobin: 12.5 g/dL — ABNORMAL LOW (ref 13.0–17.7)
MCH: 30.2 pg (ref 26.6–33.0)
MCHC: 33.1 g/dL (ref 31.5–35.7)
MCV: 91 fL (ref 79–97)
Platelets: 197 10*3/uL (ref 150–379)
RBC: 4.14 x10E6/uL (ref 4.14–5.80)
RDW: 12.4 % (ref 12.3–15.4)
WBC: 8 10*3/uL (ref 3.4–10.8)

## 2017-11-06 LAB — RPR: RPR: NONREACTIVE

## 2017-11-06 LAB — URIC A+ANA+RA QN+CRP+ASO
ASO: 57 IU/mL (ref 0.0–200.0)
Anti Nuclear Antibody(ANA): NEGATIVE
CRP: 1.6 mg/L (ref 0.0–4.9)
Rheumatoid fact SerPl-aCnc: <10 IU/mL (ref 0.0–13.9)
Uric Acid: 5.1 mg/dL (ref 3.7–8.6)

## 2017-11-06 LAB — SEDIMENTATION RATE: Sed Rate: 11 mm/hr (ref 0–30)

## 2017-11-06 LAB — HIV ANTIBODY (ROUTINE TESTING W REFLEX): HIV Screen 4th Generation wRfx: NONREACTIVE

## 2017-11-07 LAB — WOUND CULTURE

## 2017-11-09 ENCOUNTER — Ambulatory Visit: Payer: Commercial Managed Care - PPO | Admitting: Urgent Care

## 2017-11-09 ENCOUNTER — Other Ambulatory Visit: Payer: Self-pay

## 2017-11-09 ENCOUNTER — Ambulatory Visit (AMBULATORY_SURGERY_CENTER): Payer: Commercial Managed Care - PPO | Admitting: Gastroenterology

## 2017-11-09 ENCOUNTER — Encounter: Payer: Self-pay | Admitting: Gastroenterology

## 2017-11-09 VITALS — BP 154/117 | HR 66 | Temp 97.8°F | Resp 18 | Ht 69.0 in | Wt 196.0 lb

## 2017-11-09 DIAGNOSIS — Z1212 Encounter for screening for malignant neoplasm of rectum: Secondary | ICD-10-CM | POA: Diagnosis not present

## 2017-11-09 DIAGNOSIS — Z1211 Encounter for screening for malignant neoplasm of colon: Secondary | ICD-10-CM | POA: Diagnosis not present

## 2017-11-09 MED ORDER — SODIUM CHLORIDE 0.9 % IV SOLN: 500.0000 mL | Freq: Once | INTRAVENOUS | Status: DC

## 2017-11-09 NOTE — Patient Instructions (Signed)
  Thank you for allowing [place] to care for you today!  Resume normal diet and activity.     YOU HAD AN ENDOSCOPIC PROCEDURE TODAY AT THE [facility]:   Refer to the procedure report that was given to you for any specific questions about what was found during the examination.  If the procedure report does not answer your questions, please call your gastroenterologist to clarify.  If you requested that your care partner not be given the details of your procedure findings, then the procedure report has been included in a sealed envelope for you to review at your convenience later.  YOU SHOULD EXPECT: Some feelings of bloating in the abdomen. Passage of more gas than usual.  Walking can help get rid of the air that was put into your GI tract during the procedure and reduce the bloating. If you had a lower endoscopy (such as a colonoscopy or flexible sigmoidoscopy) you may notice spotting of blood in your stool or on the toilet paper. If you underwent a bowel prep for your procedure, you may not have a normal bowel movement for a few days.  Please Note:  You might notice some irritation and congestion in your nose or some drainage.  This is from the oxygen used during your procedure.  There is no need for concern and it should clear up in a day or so.  SYMPTOMS TO REPORT IMMEDIATELY:   Following lower endoscopy (colonoscopy or flexible sigmoidoscopy):  Excessive amounts of blood in the stool  Significant tenderness or worsening of abdominal pains  Swelling of the abdomen that is new, acute  Fever of 100F or higher      For urgent or emergent issues, a gastroenterologist can be reached at any hour by calling [phone].   DIET:  We do recommend a small meal at first, but then you may proceed to your regular diet.  Drink plenty of fluids but you should avoid alcoholic beverages for 24 hours.  ACTIVITY:  You should plan to take it easy for the rest of today and you should NOT  DRIVE or use heavy machinery until tomorrow (because of the sedation medicines used during the test).    FOLLOW UP: Our staff will call the number listed on your records the next business day following your procedure to check on you and address any questions or concerns that you may have regarding the information given to you following your procedure. If we do not reach you, we will leave a message.  However, if you are feeling well and you are not experiencing any problems, there is no need to return our call.  We will assume that you have returned to your regular daily activities without incident.  If any biopsies were taken you will be contacted by phone or by letter within the next 1-3 weeks.  Please call us at [phone] if you have not heard about the biopsies in 3 weeks.    SIGNATURES/CONFIDENTIALITY: You and/or your care partner have signed paperwork which will be entered into your electronic medical record.  These signatures attest to the fact that that the information above on your After Visit Summary has been reviewed and is understood.  Full responsibility of the confidentiality of this discharge information lies with you and/or your care-partner.

## 2017-11-09 NOTE — Progress Notes (Signed)
To recovery, report to RN, awake and verbal, VSS

## 2017-11-09 NOTE — Progress Notes (Signed)
Dr. Myrtie Neither notified regarding patient eating eggs at 8:20 this morning,states should not affect the prep, but to let CRNA know. Bill notified states that it should be fine to proceed at 2:30pm.

## 2017-11-09 NOTE — Progress Notes (Signed)
Pt's states no medical or surgical changes since previsit or office visit. 

## 2017-11-09 NOTE — Op Note (Signed)
[facility] Patient Name: Jake Oconnell Procedure Date: 11/09/2017 2:32 PM MRN: [id number] Endoscopist: Sherilyn Cooter L. Myrtie Neither , MD Age: 52 Referring MD:  Date of Birth: [birth year] Gender: Male Account #: [id number] Procedure:                Colonoscopy Indications:              Screening for colorectal malignant neoplasm, This                            is the patient's first colonoscopy Medicines:                Monitored Anesthesia Care Procedure:                Pre-Anesthesia Assessment:                           - Prior to the procedure, a History and Physical                            was performed, and patient medications and                            allergies were reviewed. The patient's tolerance of                            previous anesthesia was also reviewed. The risks                            and benefits of the procedure and the sedation                            options and risks were discussed with the patient.                            All questions were answered, and informed consent                            was obtained. Prior Anticoagulants: The patient has                            taken no previous anticoagulant or antiplatelet                            agents. ASA Grade Assessment: II - A patient with                            mild systemic disease. After reviewing the risks                            and benefits, the patient was deemed in                            satisfactory condition to undergo the procedure.  After obtaining informed consent, the colonoscope                            was passed under direct vision. Throughout the                            procedure, the patient's blood pressure, pulse, and                            oxygen saturations were monitored continuously. The                            Model CF-HQ190L [id number]) scope was introduced                            through the anus and advanced  to the the cecum,                            identified by appendiceal orifice and ileocecal                            valve. The colonoscopy was performed without                            difficulty. The patient tolerated the procedure                            well. The quality of the bowel preparation was                            excellent. The ileocecal valve, appendiceal                            orifice, and rectum were photographed. The quality                            of the bowel preparation was evaluated using the                            BBPS [facility] Bowel Preparation Scale) with scores                            of: Right Colon = 3, Transverse Colon = 3 and Left                            Colon = 3 (entire mucosa seen well with no residual                            staining, small fragments of stool or opaque                            liquid). The total BBPS score equals 9. Scope In: 2:46:40 PM Scope Out: 3:02:10 PM Scope Withdrawal Time:  0 hours 10 minutes 3 seconds  Total Procedure Duration: 0 hours 15 minutes 30 seconds  Findings:                 The perianal and digital rectal examinations were                            normal.                           The entire examined colon appeared normal on direct                            and retroflexion views. Complications:            No immediate complications. Estimated Blood Loss:     Estimated blood loss: none. Impression:               - The entire examined colon is normal on direct and                            retroflexion views.                           - No specimens collected. Recommendation:           - Patient has a contact number available for                            emergencies. The signs and symptoms of potential                            delayed complications were discussed with the                            patient. Return to normal activities tomorrow.                            Written  discharge instructions were provided to the                            patient.                           - Resume previous diet.                           - Continue present medications.                           - Repeat colonoscopy in 10 years for screening                            purposes. Henry L. Myrtie Neither, MD 11/09/2017 3:04:17 PM This report has been signed electronically.

## 2017-11-10 ENCOUNTER — Telehealth: Payer: Self-pay | Admitting: *Deleted

## 2017-11-10 NOTE — Telephone Encounter (Signed)
Patient returning call stating he is doing good and has no questions.

## 2017-11-10 NOTE — Telephone Encounter (Signed)
  Follow up Call-  Call back number 11/09/2017  Post procedure Call Back phone  # [phone]  Permission to leave phone message Yes  Some recent data might be hidden     Patient questions:  Do you have a fever, pain , or abdominal swelling? No. Pain Score  0 *  Have you tolerated food without any problems? Yes.    Have you been able to return to your normal activities? Yes.    Do you have any questions about your discharge instructions: Diet   No. Medications  No. Follow up visit  No.  Do you have questions or concerns about your Care? No.  Actions: * If pain score is 4 or above: No action needed, pain <4.

## 2017-11-12 ENCOUNTER — Other Ambulatory Visit: Payer: Self-pay

## 2017-11-12 ENCOUNTER — Encounter: Payer: Self-pay | Admitting: Urgent Care

## 2017-11-12 ENCOUNTER — Ambulatory Visit (INDEPENDENT_AMBULATORY_CARE_PROVIDER_SITE_OTHER): Payer: Commercial Managed Care - PPO | Admitting: Urgent Care

## 2017-11-12 VITALS — BP 102/68 | HR 68 | Temp 98.7°F | Resp 16 | Ht 69.69 in | Wt 192.0 lb

## 2017-11-12 DIAGNOSIS — T148XXA Other injury of unspecified body region, initial encounter: Secondary | ICD-10-CM

## 2017-11-12 DIAGNOSIS — A4902 Methicillin resistant Staphylococcus aureus infection, unspecified site: Secondary | ICD-10-CM

## 2017-11-12 DIAGNOSIS — L089 Local infection of the skin and subcutaneous tissue, unspecified: Secondary | ICD-10-CM

## 2017-11-12 DIAGNOSIS — L24A9 Irritant contact dermatitis due friction or contact with other specified body fluids: Secondary | ICD-10-CM

## 2017-11-12 DIAGNOSIS — L299 Pruritus, unspecified: Secondary | ICD-10-CM | POA: Diagnosis not present

## 2017-11-12 MED ORDER — SULFAMETHOXAZOLE-TRIMETHOPRIM 800-160 MG PO TABS: 1.0000 | ORAL_TABLET | Freq: Two times a day (BID) | ORAL | 0 refills | Status: DC

## 2017-11-12 MED ORDER — HYDROXYZINE HCL 25 MG PO TABS: 25.0000 mg | ORAL_TABLET | Freq: Every evening | ORAL | 0 refills | Status: DC | PRN

## 2017-11-12 NOTE — Patient Instructions (Addendum)
Hydrate well with at least 2 liters (1 gallon) of water daily. Please make sure you use a probiotic while you are taking Bactrim/Septra. Taking antibiotics for an extended period can cause diarrhea, stomach upset and a secondary stomach infection so make sure that if you start to have diarrhea, bloody stools that you come back for a recheck. Otherwise, come back to the clinic once you have finished all the antibiotic.     IF you received an x-ray today, you will receive an invoice from [facility]. Please contact [facility] at [phone] with questions or concerns regarding your invoice.   IF you received labwork today, you will receive an invoice from [facility]. Please contact [facility] at [phone] with questions or concerns regarding your invoice.   Our billing staff will not be able to assist you with questions regarding bills from these companies.  You will be contacted with the lab results as soon as they are available. The fastest way to get your results is to activate your My Chart account. Instructions are located on the last page of this paperwork. If you have not heard from [place] regarding the results in 2 weeks, please contact this office.

## 2017-11-12 NOTE — Progress Notes (Signed)
    MRN: [id number] DOB: 11/12/1965  Subjective:   Jake Oconnell is a 52 y.o. male presenting for follow up on right foot infection.  Patient has been taking Bactrim and has finished prednisone.  He reports a dramatic improvement in his infection.  He no longer has pain, drainage, itching.  He has taken some time from work and is changed out all his shoes.  Denies fever, nausea, vomiting, belly pain.  Jake Oconnell has a current medication list which includes the following prescription(s): ibuprofen, prednisone, and sulfamethoxazole-trimethoprim, and the following Facility-Administered Medications: sodium chloride. Also has No Known Allergies.  Jake Oconnell  has no past medical history on file. Also  has no past surgical history on file.  Objective:   Vitals: BP 102/68   Pulse 68   Temp 98.7 F (37.1 C) (Oral)   Resp 16   Ht 5' 9.69" (1.77 m)   Wt 192 lb (87.1 kg)   SpO2 99%   BMI 27.80 kg/m   Physical Exam  Constitutional: He is oriented to person, place, and time. He appears well-developed and well-nourished.  Cardiovascular: Normal rate.  Pulmonary/Chest: Effort normal.  Neurological: He is alert and oriented to person, place, and time.  Skin: Skin is warm and dry.  Multiple resolving wounds over his right foot both laterally immediately and toward his heel.  There is no more drainage.  He also has a resolving wound over his left medial foot toward his heel.   Assessment and Plan :   Right foot infection - Plan: sulfamethoxazole-trimethoprim (BACTRIM DS,SEPTRA DS) 800-160 MG tablet  Left foot infection - Plan: sulfamethoxazole-trimethoprim (BACTRIM DS,SEPTRA DS) 800-160 MG tablet  MRSA infection  Drainage from wound - Plan: sulfamethoxazole-trimethoprim (BACTRIM DS,SEPTRA DS) 800-160 MG tablet  Itching  Patient is progressing well.  I reviewed his labs with him in clinic and he appears to have a severe MRSA infection.  I will extend his Bactrim and additional 10 days.  Patient is to return  to clinic thereafter for recheck.  In the meantime, he may use hydroxyzine for itching. Counseled patient on potential for adverse effects with medications prescribed today, patient verbalized understanding.  Wallis Bamberg, PA-C [facility] [phone] 11/12/2017 4:58 PM

## 2017-12-07 ENCOUNTER — Ambulatory Visit: Payer: Commercial Managed Care - PPO | Admitting: Urgent Care

## 2017-12-07 ENCOUNTER — Encounter: Payer: Self-pay | Admitting: Urgent Care

## 2017-12-07 VITALS — BP 120/78 | HR 84 | Temp 98.7°F | Resp 16 | Ht 69.0 in | Wt 192.0 lb

## 2017-12-07 DIAGNOSIS — T148XXA Other injury of unspecified body region, initial encounter: Secondary | ICD-10-CM | POA: Diagnosis not present

## 2017-12-07 DIAGNOSIS — L24A9 Irritant contact dermatitis due friction or contact with other specified body fluids: Secondary | ICD-10-CM

## 2017-12-07 DIAGNOSIS — L089 Local infection of the skin and subcutaneous tissue, unspecified: Secondary | ICD-10-CM

## 2017-12-07 DIAGNOSIS — L988 Other specified disorders of the skin and subcutaneous tissue: Secondary | ICD-10-CM | POA: Diagnosis not present

## 2017-12-07 MED ORDER — HYDROXYZINE HCL 25 MG PO TABS: 25.0000 mg | ORAL_TABLET | Freq: Three times a day (TID) | ORAL | 5 refills | Status: AC | PRN

## 2017-12-07 MED ORDER — PREDNISONE 10 MG PO TABS: 20.0000 mg | ORAL_TABLET | Freq: Every day | ORAL | 0 refills | Status: DC

## 2017-12-07 MED ORDER — SULFAMETHOXAZOLE-TRIMETHOPRIM 800-160 MG PO TABS: 1.0000 | ORAL_TABLET | Freq: Two times a day (BID) | ORAL | 0 refills | Status: DC

## 2017-12-07 NOTE — Patient Instructions (Addendum)
Please use Vistaril 1-2 tablets up to 3 times daily for itching. We will do one more round of prednisone and Bactrim. You will get a call for a referral to a skin doctor in up to 2 weeks.     IF you received an x-ray today, you will receive an invoice from [facility]. Please contact [facility] at [phone] with questions or concerns regarding your invoice.   IF you received labwork today, you will receive an invoice from [facility]. Please contact [facility] at [phone] with questions or concerns regarding your invoice.   Our billing staff will not be able to assist you with questions regarding bills from these companies.  You will be contacted with the lab results as soon as they are available. The fastest way to get your results is to activate your My Chart account. Instructions are located on the last page of this paperwork. If you have not heard from us regarding the results in 2 weeks, please contact this office.

## 2017-12-07 NOTE — Progress Notes (Signed)
    MRN: [id number] DOB: 09/27/1965  Subjective:   Jake Oconnell is a 52 y.o. male presenting for follow up on persistent and recurrent right foot and left foot infections at his last office visit on 11/12/2017 patient was doing well and just had to finish his course of Bactrim.  Wound cultures confirmed that he still had his infection.  Today, he reports that he has not completely healed albeit he is so much better.  He still has a few wounds on his right foot that he reports are painful and slightly draining.  Overall, he is happy with his progress.  Still has itching as well.  Reports that the only time that he was in itching was when he was on prednisone.  He is also previously taken doxycycline in April with resolution of his foot infection.  Jake Oconnell  Also has No Known Allergies.  Jake Oconnell denies past medical and surgical history.   Objective:   Vitals: BP 120/78   Pulse 84   Temp 98.7 F (37.1 C) (Oral)   Resp 16   Ht 5\' 9"  (1.753 m)   Wt 192 lb (87.1 kg)   SpO2 99%   BMI 28.35 kg/m   Physical Exam  Constitutional: He is oriented to person, place, and time. He appears well-developed and well-nourished.  Cardiovascular: Normal rate.  Pulmonary/Chest: Effort normal.  Neurological: He is alert and oriented to person, place, and time.  Skin: Skin is warm and dry.  There are 3 plaque-like lesions over medial aspect of right foot and heel.  A plaque-like lesion over his heel extends medially toward the posterior portion of his heel.  He has other plaque-like lesions of varying sizes between 1 and 3 cm in diameter.  He has 3 wounds that look as if they are draining.  The one wound over his plantar surface of his foot is tender.  His left foot has approximately 2 plaque-like lesions of varying sizes between 1 and 2 cm.   Assessment and Plan :   Right foot infection - Plan: sulfamethoxazole-trimethoprim (BACTRIM DS,SEPTRA DS) 800-160 MG tablet, Ambulatory referral to Dermatology  Drainage from  wound - Plan: sulfamethoxazole-trimethoprim (BACTRIM DS,SEPTRA DS) 800-160 MG tablet, Ambulatory referral to Dermatology  Left foot infection - Plan: sulfamethoxazole-trimethoprim (BACTRIM DS,SEPTRA DS) 800-160 MG tablet, Ambulatory referral to Dermatology  Skin plaque - Plan: Ambulatory referral to Dermatology  I recommended patient take an additional Bactrim course of 7 days.  I will also start patient on prednisone at a low dose of 20 mg for the next 5 days.  I am concerned that he continues to have a non-resolving infection despite having taken doxycycline and Bactrim.  Wound culture results show susceptibility to this.  However he does have plaque-like lesions over his feet bilaterally.  I will refer to dermatology to have this evaluated.  Patient can continue to take hydroxyzine at a dose of 25 to 50 mg 3 times daily.  Return to clinic if symptoms worsen prior to his referral to dermatology.    Jake BambergMario Keeara Frees, PA-C [facility] [phone] 12/07/2017 4:26 PM

## 2019-10-20 ENCOUNTER — Encounter (HOSPITAL_COMMUNITY): Payer: Self-pay

## 2019-10-20 ENCOUNTER — Ambulatory Visit (HOSPITAL_COMMUNITY)
Admission: EM | Admit: 2019-10-20 | Discharge: 2019-10-20 | Disposition: A | Discharge status: Home / Self Care | Payer: Commercial Managed Care - PPO | Attending: Family Medicine | Admitting: Family Medicine

## 2019-10-20 ENCOUNTER — Other Ambulatory Visit: Payer: Self-pay

## 2019-10-20 DIAGNOSIS — T148XXA Other injury of unspecified body region, initial encounter: Secondary | ICD-10-CM

## 2019-10-20 DIAGNOSIS — L089 Local infection of the skin and subcutaneous tissue, unspecified: Secondary | ICD-10-CM | POA: Diagnosis not present

## 2019-10-20 DIAGNOSIS — L24A9 Irritant contact dermatitis due friction or contact with other specified body fluids: Secondary | ICD-10-CM

## 2019-10-20 MED ORDER — SULFAMETHOXAZOLE-TRIMETHOPRIM 800-160 MG PO TABS: 1.0000 | ORAL_TABLET | Freq: Two times a day (BID) | ORAL | 1 refills | Status: DC

## 2019-10-20 MED ORDER — IBUPROFEN 800 MG PO TABS: 800.0000 mg | ORAL_TABLET | Freq: Three times a day (TID) | ORAL | 0 refills | Status: DC

## 2019-10-20 NOTE — Discharge Instructions (Signed)
Take the antibiotic 2 times a day for 7 days, take the antibiotic with food I have given you a refill in case it is needed May take ibuprofen 3 times a day with food.  This is for pain Return for problems

## 2019-10-20 NOTE — ED Provider Notes (Signed)
[facility]    CSN: [id number] Arrival date & time: 10/20/19  1312      History   Chief Complaint Chief Complaint  Patient presents with  . Foot Pain    HPI Jake Oconnell is a 54 y.o. male.   HPI  Patient has a history of recurring foot infections. He also has athlete's foot. He is here for right foot infection on the out side of his right foot.  It is along the proximal fifth metatarsal region, laterally.  There is swelling.  Yellow color under the skin.  Pinpoint open area.  No drainage.  No foreign body patient remembers.  No fever or chills. Is old cultures did reveal staph and strep.  He had MRSA.  He did respond well to Septra  History reviewed. No pertinent past medical history.  There are no problems to display for this patient.   History reviewed. No pertinent surgical history.     Home Medications    Prior to Admission medications   Medication Sig Start Date End Date Taking? Authorizing Provider  hydrOXYzine (ATARAX/VISTARIL) 25 MG tablet Take 1-2 tablets (25-50 mg total) by mouth every 8 (eight) hours as needed for itching. 12/07/17   Jaynee Eagles, PA-C  ibuprofen (ADVIL) 800 MG tablet Take 1 tablet (800 mg total) by mouth 3 (three) times daily. 10/20/19   Raylene Everts, MD  predniSONE (DELTASONE) 10 MG tablet Take 2 tablets (20 mg total) by mouth daily with breakfast. 12/07/17   Jaynee Eagles, PA-C  sulfamethoxazole-trimethoprim (BACTRIM DS) 800-160 MG tablet Take 1 tablet by mouth 2 (two) times daily. Refill If needed 10/20/19   Raylene Everts, MD    Family History Family History  Problem Relation Age of Onset  . Colon cancer Neg Hx   . Colon polyps Neg Hx   . Esophageal cancer Neg Hx   . Rectal cancer Neg Hx   . Stomach cancer Neg Hx     Social History Social History   Tobacco Use  . Smoking status: Current Every Day Smoker  . Smokeless tobacco: Never Used  . Tobacco comment: sometimes  Substance Use Topics  . Alcohol use: No  .  Drug use: No     Allergies   Patient has no known allergies.   Review of Systems Review of Systems  Musculoskeletal: Positive for gait problem.  Skin: Positive for rash and wound.     Physical Exam Triage Vital Signs ED Triage Vitals  Enc Vitals Group     BP 10/20/19 1337 114/83     Pulse Rate 10/20/19 1337 60     Resp 10/20/19 1337 18     Temp 10/20/19 1337 98.5 F (36.9 C)     Temp Source 10/20/19 1337 Oral     SpO2 10/20/19 1337 97 %     Weight --      Height --      Head Circumference --      Peak Flow --      Pain Score 10/20/19 1336 8     Pain Loc --      Pain Edu? --      Excl. in [place]? --    No data found.  Updated Vital Signs BP 114/83 (BP Location: Right Arm)   Pulse 60   Temp 98.5 F (36.9 C) (Oral)   Resp 18   SpO2 97%   Visual Acuity Right Eye Distance:   Left Eye Distance:   Bilateral Distance:  Right Eye Near:   Left Eye Near:    Bilateral Near:     Physical Exam Constitutional:      General: He is not in acute distress.    Appearance: He is well-developed.  HENT:     Head: Normocephalic and atraumatic.  Eyes:     Conjunctiva/sclera: Conjunctivae normal.     Pupils: Pupils are equal, round, and reactive to light.  Cardiovascular:     Rate and Rhythm: Normal rate.  Pulmonary:     Effort: Pulmonary effort is normal. No respiratory distress.  Musculoskeletal:        General: Normal range of motion.     Cervical back: Normal range of motion.       Feet:  Skin:    General: Skin is warm and dry.     Comments: Abscess right foot  Neurological:     Mental Status: He is alert.  Psychiatric:        Mood and Affect: Mood normal.        Behavior: Behavior normal.      UC Treatments / Results  Labs (all labs ordered are listed, but only abnormal results are displayed) Labs Reviewed - No data to display  EKG   Radiology No results found.  Procedures Procedures (including critical care time)  Medications Ordered in  UC Medications - No data to display  Initial Impression / Assessment and Plan / UC Course  I have reviewed the triage vital signs and the nursing notes.  Pertinent labs & imaging results that were available during my care of the patient were reviewed by me and considered in my medical decision making (see chart for details).     Viewed wound care. Recommend over-the-counter treatment of his athlete's foot with Lamisil Final Clinical Impressions(s) / UC Diagnoses   Final diagnoses:  Right foot infection  Drainage from wound  Left foot infection     Discharge Instructions     Take the antibiotic 2 times a day for 7 days, take the antibiotic with food I have given you a refill in case it is needed May take ibuprofen 3 times a day with food.  This is for pain Return for problems   ED Prescriptions    Medication Sig Dispense Auth. Provider   sulfamethoxazole-trimethoprim (BACTRIM DS) 800-160 MG tablet Take 1 tablet by mouth 2 (two) times daily. Refill If needed 14 tablet Eustace Moore, MD   ibuprofen (ADVIL) 800 MG tablet Take 1 tablet (800 mg total) by mouth 3 (three) times daily. 21 tablet Eustace Moore, MD     PDMP not reviewed this encounter.   Eustace Moore, MD 10/20/19 2115

## 2019-10-20 NOTE — ED Triage Notes (Signed)
Pt presents to UC with pain and swelling on right foot x 3 days. Pt states 3 days ago some drainage is coming out of his right foot.

## 2020-01-27 ENCOUNTER — Other Ambulatory Visit: Payer: Self-pay

## 2020-01-27 ENCOUNTER — Encounter (HOSPITAL_COMMUNITY): Payer: Self-pay | Admitting: *Deleted

## 2020-01-27 ENCOUNTER — Ambulatory Visit (HOSPITAL_COMMUNITY)
Admission: EM | Admit: 2020-01-27 | Discharge: 2020-01-27 | Disposition: A | Discharge status: Home / Self Care | Payer: Commercial Managed Care - PPO | Attending: Emergency Medicine | Admitting: Emergency Medicine

## 2020-01-27 DIAGNOSIS — K649 Unspecified hemorrhoids: Secondary | ICD-10-CM

## 2020-01-27 DIAGNOSIS — S39012A Strain of muscle, fascia and tendon of lower back, initial encounter: Secondary | ICD-10-CM | POA: Diagnosis not present

## 2020-01-27 DIAGNOSIS — K625 Hemorrhage of anus and rectum: Secondary | ICD-10-CM

## 2020-01-27 MED ORDER — NAPROXEN 375 MG PO TABS: 375.0000 mg | ORAL_TABLET | Freq: Two times a day (BID) | ORAL | 0 refills | Status: DC

## 2020-01-27 MED ORDER — HYDROCORTISONE ACETATE 25 MG RE SUPP: 25.0000 mg | Freq: Two times a day (BID) | RECTAL | 0 refills | Status: AC

## 2020-01-27 NOTE — ED Triage Notes (Signed)
Pt reports bending over to clean 2 days ago, when he stood up felt sudden onset low back pain.  States pain continues and radiated down into BLE; describes BLE parasthesia in upper legs.

## 2020-01-27 NOTE — Discharge Instructions (Signed)
I feel your bleeding is from hemorrhoids, may try using suppositories as needed.  Drink plenty of water. Don't sit too long on toilet, avoid constipation and diarrhea as able.  Please follow up with a PCP if this persists as you may need a colonoscopy referral or follow up with gastroenterologist.  If getting weak, dizzy, fevers, abdominal pain, constant bleeding, or otherwise worsening please go to the ER.  Naproxen twice a day for your back pain. Take with food.  Light and regular activity as tolerated.  Heat application while active can help with muscle spasms.  Sleep with pillow under your knees.

## 2020-01-27 NOTE — ED Provider Notes (Signed)
[facility]    CSN: [id number] Arrival date & time: 01/27/20  1129      History   Chief Complaint Chief Complaint  Patient presents with  . Back Pain    HPI Jake Oconnell is a 54 y.o. male.   Jake Oconnell presents with complaints of low back pain which started two days ago after bending to clean his over. When he stood up he noted significant pain. Hasn't worsened but hasn't improved. Radiates to bilateral thighs. No saddle symptoms. No loss of bladder or bowel control. Noted bright red blood to stool this morning. No abdominal or rectal pain. No fevers. No previous similar. No known history of hemorrhoids. Denies any history of constipation. No dizziness. No previous colonoscopy. He does smoke. Hasn't taken any medications for symptoms.    ROS per HPI, negative if not otherwise mentioned.      History reviewed. No pertinent past medical history.  There are no problems to display for this patient.   History reviewed. No pertinent surgical history.     Home Medications    Prior to Admission medications   Medication Sig Start Date End Date Taking? Authorizing Provider  hydrocortisone (ANUSOL-HC) 25 MG suppository Place 1 suppository (25 mg total) rectally 2 (two) times daily. 01/27/20   Georgetta Haber, NP  hydrOXYzine (ATARAX/VISTARIL) 25 MG tablet Take 1-2 tablets (25-50 mg total) by mouth every 8 (eight) hours as needed for itching. 12/07/17   Wallis Bamberg, PA-C  ibuprofen (ADVIL) 800 MG tablet Take 1 tablet (800 mg total) by mouth 3 (three) times daily. 10/20/19   Eustace Moore, MD  naproxen (NAPROSYN) 375 MG tablet Take 1 tablet (375 mg total) by mouth 2 (two) times daily. 01/27/20   Georgetta Haber, NP  predniSONE (DELTASONE) 10 MG tablet Take 2 tablets (20 mg total) by mouth daily with breakfast. 12/07/17   Wallis Bamberg, PA-C  sulfamethoxazole-trimethoprim (BACTRIM DS) 800-160 MG tablet Take 1 tablet by mouth 2 (two) times daily. Refill If needed 10/20/19    Eustace Moore, MD    Family History Family History  Problem Relation Age of Onset  . Colon cancer Neg Hx   . Colon polyps Neg Hx   . Esophageal cancer Neg Hx   . Rectal cancer Neg Hx   . Stomach cancer Neg Hx     Social History Social History   Tobacco Use  . Smoking status: Current Every Day Smoker  . Smokeless tobacco: Never Used  . Tobacco comment: sometimes  Vaping Use  . Vaping Use: Never used  Substance Use Topics  . Alcohol use: No  . Drug use: No     Allergies   Patient has no known allergies.   Review of Systems Review of Systems   Physical Exam Triage Vital Signs ED Triage Vitals  Enc Vitals Group     BP 01/27/20 1344 115/77     Pulse Rate 01/27/20 1344 71     Resp 01/27/20 1344 16     Temp 01/27/20 1344 97.8 F (36.6 C)     Temp Source 01/27/20 1344 Oral     SpO2 01/27/20 1344 100 %     Weight --      Height --      Head Circumference --      Peak Flow --      Pain Score 01/27/20 1345 8     Pain Loc --      Pain Edu? --  Excl. in [place]? --    No data found.  Updated Vital Signs BP 115/77   Pulse 71   Temp 97.8 F (36.6 C) (Oral)   Resp 16   SpO2 100%   Visual Acuity Right Eye Distance:   Left Eye Distance:   Bilateral Distance:    Right Eye Near:   Left Eye Near:    Bilateral Near:     Physical Exam Constitutional:      Appearance: He is well-developed.  Cardiovascular:     Rate and Rhythm: Normal rate.  Pulmonary:     Effort: Pulmonary effort is normal.  Abdominal:     Tenderness: There is no abdominal tenderness. There is no guarding.  Genitourinary:    Rectum: External hemorrhoid present.  Musculoskeletal:     Lumbar back: Spasms, tenderness and bony tenderness present. No lacerations. Normal range of motion. Positive right straight leg raise test and positive left straight leg raise test.     Comments: strength equal bilaterally; gross sensation intact to lower extremities and ambulatory without difficulty;  pain with transition from sit to lay and lay to sit   Skin:    General: Skin is warm and dry.  Neurological:     Mental Status: He is alert and oriented to person, place, and time.      UC Treatments / Results  Labs (all labs ordered are listed, but only abnormal results are displayed) Labs Reviewed - No data to display  EKG   Radiology No results found.  Procedures Procedures (including critical care time)  Medications Ordered in UC Medications - No data to display  Initial Impression / Assessment and Plan / UC Course  I have reviewed the triage vital signs and the nursing notes.  Pertinent labs & imaging results that were available during my care of the patient were reviewed by me and considered in my medical decision making (see chart for details).     No red flag findings with back pain, pain after bend/lift injury. Pain management discussed. One episode of rectal bleeding with BM today. No pain. No fevers, no dizziness. Ext hemorrhoids on exam, no gross bleeding visualized. Supportive cares with diet, hydration and toileting habits discussed and encouraged follow up with PCP for recheck as has not had a colonoscopy. Return precautions provided. Patient verbalized understanding and agreeable to plan.   Final Clinical Impressions(s) / UC Diagnoses   Final diagnoses:  Strain of lumbar region, initial encounter  Hemorrhoids, unspecified hemorrhoid type  Rectal bleeding     Discharge Instructions     I feel your bleeding is from hemorrhoids, may try using suppositories as needed.  Drink plenty of water. Don't sit too long on toilet, avoid constipation and diarrhea as able.  Please follow up with a PCP if this persists as you may need a colonoscopy referral or follow up with gastroenterologist.  If getting weak, dizzy, fevers, abdominal pain, constant bleeding, or otherwise worsening please go to the ER.  Naproxen twice a day for your back pain. Take with food.    Light and regular activity as tolerated.  Heat application while active can help with muscle spasms.  Sleep with pillow under your knees.     ED Prescriptions    Medication Sig Dispense Auth. Provider   hydrocortisone (ANUSOL-HC) 25 MG suppository Place 1 suppository (25 mg total) rectally 2 (two) times daily. 12 suppository Aleasha Fregeau B, NP   naproxen (NAPROSYN) 375 MG tablet Take 1 tablet (375 mg total)  by mouth 2 (two) times daily. 20 tablet Georgetta Haber, NP     PDMP not reviewed this encounter.   Linus Mako B, NP 01/28/20 0930

## 2020-11-05 ENCOUNTER — Other Ambulatory Visit: Payer: Self-pay

## 2020-11-05 ENCOUNTER — Ambulatory Visit (INDEPENDENT_AMBULATORY_CARE_PROVIDER_SITE_OTHER): Payer: Commercial Managed Care - PPO

## 2020-11-05 ENCOUNTER — Ambulatory Visit (HOSPITAL_COMMUNITY)
Admission: EM | Admit: 2020-11-05 | Discharge: 2020-11-05 | Disposition: A | Discharge status: Home / Self Care | Payer: Commercial Managed Care - PPO | Attending: Physician Assistant | Admitting: Physician Assistant

## 2020-11-05 ENCOUNTER — Ambulatory Visit (HOSPITAL_COMMUNITY): Payer: Commercial Managed Care - PPO

## 2020-11-05 ENCOUNTER — Encounter (HOSPITAL_COMMUNITY): Payer: Self-pay

## 2020-11-05 DIAGNOSIS — M25572 Pain in left ankle and joints of left foot: Secondary | ICD-10-CM

## 2020-11-05 DIAGNOSIS — R9389 Abnormal findings on diagnostic imaging of other specified body structures: Secondary | ICD-10-CM | POA: Diagnosis not present

## 2020-11-05 DIAGNOSIS — M19072 Primary osteoarthritis, left ankle and foot: Secondary | ICD-10-CM | POA: Diagnosis not present

## 2020-11-05 MED ORDER — MELOXICAM 15 MG PO TABS: 15.0000 mg | ORAL_TABLET | Freq: Every day | ORAL | 0 refills | Status: DC

## 2020-11-05 NOTE — Discharge Instructions (Signed)
Take Mobic once daily to help with pain and inflammation.  Keep area elevated and wrapped.  Wear supportive footwear.  He should not take additional NSAIDs including aspirin, ibuprofen/Advil, naproxen/Aleve with this medication as it can cause GI bleeding.  Follow-up with ankle specialist if symptoms persist.  Please follow-up with PCP for repeat x-ray in 3 months as we discussed.

## 2020-11-05 NOTE — ED Provider Notes (Addendum)
[facility]    CSN: [id number] Arrival date & time: 11/05/20  1639      History   Chief Complaint Chief Complaint  Patient presents with  . Foot Pain    HPI Jake Oconnell is a 55 y.o. male.   Patient presents today with a 3 to 4-day history of left ankle pain that is gradually been worsening.  Pain is currently rated 8 on a 0-10 pain scale but increases to 10 with attempted ambulation.  Reports he has been unable to bear weight for the last 24 hours as a result of pain.  Pain is localized to left medial malleolus, described as throbbing/sharp, worse with ambulation, improved with rest.  He denies any known injury or increase in activity prior to symptom onset.  He has tried Tylenol without improvement of symptoms.  Denies previous injury or surgery on left ankle.  He denies history of VTE events, recent immobilization, recent surgical procedure, recent travel, exogenous hormone use, history of malignancy.  He denies any chest pain, shortness of breath, numbness, paresthesias.  He has been unable to wear shoes as a result of swelling and pain.  Symptoms are interfering with his ability to perform daily activities.     History reviewed. No pertinent past medical history.  There are no problems to display for this patient.   History reviewed. No pertinent surgical history.     Home Medications    Prior to Admission medications   Medication Sig Start Date End Date Taking? Authorizing Provider  meloxicam (MOBIC) 15 MG tablet Take 1 tablet (15 mg total) by mouth daily. 11/05/20  Yes Alexei Doswell, Noberto Retort, PA-C  hydrocortisone (ANUSOL-HC) 25 MG suppository Place 1 suppository (25 mg total) rectally 2 (two) times daily. 01/27/20   Georgetta Haber, NP  hydrOXYzine (ATARAX/VISTARIL) 25 MG tablet Take 1-2 tablets (25-50 mg total) by mouth every 8 (eight) hours as needed for itching. 12/07/17   Wallis Bamberg, PA-C    Family History Family History  Problem Relation Age of Onset  . Colon  cancer Neg Hx   . Colon polyps Neg Hx   . Esophageal cancer Neg Hx   . Rectal cancer Neg Hx   . Stomach cancer Neg Hx     Social History Social History   Tobacco Use  . Smoking status: Current Every Day Smoker  . Smokeless tobacco: Never Used  . Tobacco comment: sometimes  Vaping Use  . Vaping Use: Never used  Substance Use Topics  . Alcohol use: No  . Drug use: No     Allergies   Patient has no known allergies.   Review of Systems Review of Systems  Constitutional: Positive for activity change. Negative for appetite change, fatigue and fever.  Respiratory: Negative for cough and shortness of breath.   Cardiovascular: Positive for leg swelling (right ankle). Negative for chest pain and palpitations.  Gastrointestinal: Negative for abdominal pain, diarrhea, nausea and vomiting.  Musculoskeletal: Positive for arthralgias, gait problem and joint swelling. Negative for myalgias.  Neurological: Negative for dizziness, weakness, light-headedness, numbness and headaches.     Physical Exam Triage Vital Signs ED Triage Vitals  Enc Vitals Group     BP 11/05/20 1754 (!) 132/91     Pulse Rate 11/05/20 1754 88     Resp 11/05/20 1754 16     Temp 11/05/20 1754 98.4 F (36.9 C)     Temp Source 11/05/20 1754 Oral     SpO2 11/05/20 1754 100 %  Weight --      Height --      Head Circumference --      Peak Flow --      Pain Score 11/05/20 1753 10     Pain Loc --      Pain Edu? --      Excl. in GC? --    No data found.  Updated Vital Signs BP (!) 132/91 (BP Location: Right Arm)   Pulse 88   Temp 98.4 F (36.9 C) (Oral)   Resp 16   SpO2 100%   Visual Acuity Right Eye Distance:   Left Eye Distance:   Bilateral Distance:    Right Eye Near:   Left Eye Near:    Bilateral Near:     Physical Exam Vitals reviewed.  Constitutional:      General: He is awake.     Appearance: Normal appearance. He is normal weight. He is not ill-appearing.     Comments: Very  pleasant male appears stated age in no acute distress  HENT:     Head: Normocephalic and atraumatic.  Cardiovascular:     Rate and Rhythm: Normal rate and regular rhythm.     Pulses:          Dorsalis pedis pulses are 2+ on the right side and 2+ on the left side.       Posterior tibial pulses are 2+ on the right side and 2+ on the left side.     Heart sounds: No murmur heard.     Comments: 1+ pitting edema at right ankle. Pulmonary:     Effort: Pulmonary effort is normal.     Breath sounds: Normal breath sounds. No stridor. No wheezing, rhonchi or rales.     Comments: Clear to auscultation bilaterally Abdominal:     General: Bowel sounds are normal.     Palpations: Abdomen is soft.     Tenderness: There is no abdominal tenderness.  Musculoskeletal:     Right ankle: No swelling or deformity. No tenderness. Normal range of motion.     Left ankle: Swelling present. No deformity. Tenderness present over the medial malleolus. Decreased range of motion.     Comments: Left foot neurovascularly intact.  Tenderness palpation over medial malleolus.  Moderate swelling noted at left ankle.  Strength 5/5.  Decreased range of motion with inversion and eversion.  Neurological:     Mental Status: He is alert.  Psychiatric:        Behavior: Behavior is cooperative.      UC Treatments / Results  Labs (all labs ordered are listed, but only abnormal results are displayed) Labs Reviewed - No data to display  EKG   Radiology DG Ankle Complete Left  Result Date: 11/05/2020 CLINICAL DATA:  Pain and swelling EXAM: LEFT ANKLE COMPLETE - 3+ VIEW COMPARISON:  None. FINDINGS: Frontal, oblique, and lateral views were obtained. No fracture or appreciable joint effusion. There is generalized osteoarthritic change in the ankle joint. There is relative lucency in the distal tibia with sclerotic periphery which has a benign appearance. Ankle mortise appears grossly intact. IMPRESSION: Generalized  osteoarthritic change. No acute fracture. Relative lucency in the distal tibial metaphysis measuring approximately 2.8 x 2.3 cm. This area has a sclerotic periphery and an overall benign appearance. As there are no prior studies to compare, a follow-up study in 3 months to assess for stability would be advisable. Ankle mortise appears intact. Electronically Signed   By: Bretta Bang III M.D.  On: 11/05/2020 18:37    Procedures Procedures (including critical care time)  Medications Ordered in UC Medications - No data to display  Initial Impression / Assessment and Plan / UC Course  I have reviewed the triage vital signs and the nursing notes.  Pertinent labs & imaging results that were available during my care of the patient were reviewed by me and considered in my medical decision making (see chart for details).     X-ray showed no evidence of acute fracture.  It did show evidence of osteoarthritis and so we will treat with Mobic.  Discussed possibility that symptoms could be related to gout and discussed that if NSAIDs are ineffective can consider prednisone burst at follow-up.  Patient was instructed not to take additional NSAIDs with this medication due to risk of GI bleeding.  Encouraged him to keep area elevated and wrapped to manage symptoms.  He was provided contact information for podiatrist in case of symptoms persist.  Sclerotic area noted on x-ray with recommendation for repeat x-ray in 3 months.  Discussed this with patient and encouraged him to follow-up with PCP; he does not have a PCP but we will try to establish him with one via PCP assistance.  Strict return precautions given to which patient expressed understanding.  Final Clinical Impressions(s) / UC Diagnoses   Final diagnoses:  Acute left ankle pain  Primary osteoarthritis of left ankle  Abnormal x-ray     Discharge Instructions     Take Mobic once daily to help with pain and inflammation.  Keep area elevated  and wrapped.  Wear supportive footwear.  He should not take additional NSAIDs including aspirin, ibuprofen/Advil, naproxen/Aleve with this medication as it can cause GI bleeding.  Follow-up with ankle specialist if symptoms persist.  Please follow-up with PCP for repeat x-ray in 3 months as we discussed.    ED Prescriptions    Medication Sig Dispense Auth. Provider   meloxicam (MOBIC) 15 MG tablet Take 1 tablet (15 mg total) by mouth daily. 15 tablet Delorise Hunkele, Noberto Retort, PA-C     PDMP not reviewed this encounter.   Jeani Hawking, PA-C 11/05/20 1904    Jeani Hawking, PA-C 11/05/20 1905

## 2020-11-05 NOTE — ED Triage Notes (Signed)
Pt present left foot pain with swelling. Pt state he did not injury. Pt state having difficulties walking and symptom started 4 days ago.

## 2020-11-10 ENCOUNTER — Encounter: Payer: Self-pay | Admitting: *Deleted

## 2020-11-27 ENCOUNTER — Other Ambulatory Visit: Payer: Self-pay

## 2020-11-27 ENCOUNTER — Ambulatory Visit (HOSPITAL_COMMUNITY)
Admission: EM | Admit: 2020-11-27 | Discharge: 2020-11-27 | Disposition: A | Discharge status: Home / Self Care | Payer: Commercial Managed Care - PPO | Attending: Physician Assistant | Admitting: Physician Assistant

## 2020-11-27 ENCOUNTER — Encounter (HOSPITAL_COMMUNITY): Payer: Self-pay | Admitting: Emergency Medicine

## 2020-11-27 DIAGNOSIS — L282 Other prurigo: Secondary | ICD-10-CM

## 2020-11-27 DIAGNOSIS — M25572 Pain in left ankle and joints of left foot: Secondary | ICD-10-CM

## 2020-11-27 DIAGNOSIS — G8929 Other chronic pain: Secondary | ICD-10-CM | POA: Diagnosis not present

## 2020-11-27 MED ORDER — DESONIDE 0.05 % EX CREA: TOPICAL_CREAM | Freq: Two times a day (BID) | CUTANEOUS | 1 refills | Status: AC

## 2020-11-27 MED ORDER — MELOXICAM 15 MG PO TABS: 15.0000 mg | ORAL_TABLET | Freq: Every day | ORAL | 0 refills | Status: DC

## 2020-11-27 NOTE — ED Triage Notes (Signed)
Pt is present today f/u with left foot pain. Pt states that his foot feels better that he felt the last visit. Pt states that his foot bothers him slightly but not much anymore. Pt denies any swelling

## 2020-11-27 NOTE — Discharge Instructions (Addendum)
I have called in a refill of your Mobic to help with your pain.  Please call [facility] to schedule a new patient appointment.  You will need an x-ray to monitor that area we noticed in approximately 2 months.  I have also called in a cream to help with the itching.  You can alternate famotidine and cetirizine for additional symptom relief.  Make sure you are moisturizing the area.  If anything worsens please return for reevaluation.

## 2020-11-27 NOTE — ED Provider Notes (Signed)
[facility]    CSN: [id number] Arrival date & time: 11/27/20  1620      History   Chief Complaint Chief Complaint  Patient presents with  . Foot Pain    HPI Jake Oconnell is a 55 y.o. male.   HPI  Patient presents today for follow-up of left foot/ankle pain.  He was last seen 11/05/2020 when x-ray was obtained that showed osteoarthritic changes without acute fracture but did show lesion in the distal tibial metaphysis with recommended follow-up study in 3 months.  Patient was started on Mobic and had significant improvement of symptoms.  Since his last office visit, patient reports significant improvement of symptoms but continues to have pain particularly with prolonged ambulation.  Currently he is without pain but during episodes pain increases to 5/6 on a 0-10 pain scale, localized to left ankle with radiation into foot, described as aching, no aggravating or alleviating factors identified.  He reports swelling has resolved and he denies any numbness or paresthesias.  He was contacted by PCP office but was unsure how to schedule with them.  He is requesting information for PCP to schedule with in the future for follow-up x-ray as we have previously discussed.  In addition, patient reports several month history of pruritic rash on his right foot.  He has tried moisturizing cream without improvement of symptoms.  Denies any changes to personal hygiene products including soaps or detergents.  States area of rash has not spread.  He denies history of dermatological condition.  He has not tried any over-the-counter medications for symptom management.  Reports symptoms sometimes wake him up at night  History reviewed. No pertinent past medical history.  There are no problems to display for this patient.   History reviewed. No pertinent surgical history.     Home Medications    Prior to Admission medications   Medication Sig Start Date End Date Taking? Authorizing Provider   desonide (DESOWEN) 0.05 % cream Apply topically 2 (two) times daily. 11/27/20  Yes Alisan Dokes, Noberto Retort, PA-C  hydrocortisone (ANUSOL-HC) 25 MG suppository Place 1 suppository (25 mg total) rectally 2 (two) times daily. 01/27/20   Georgetta Haber, NP  hydrOXYzine (ATARAX/VISTARIL) 25 MG tablet Take 1-2 tablets (25-50 mg total) by mouth every 8 (eight) hours as needed for itching. 12/07/17   Wallis Bamberg, PA-C  meloxicam (MOBIC) 15 MG tablet Take 1 tablet (15 mg total) by mouth daily. 11/27/20   Yaretzi Ernandez, Noberto Retort, PA-C    Family History Family History  Problem Relation Age of Onset  . Colon cancer Neg Hx   . Colon polyps Neg Hx   . Esophageal cancer Neg Hx   . Rectal cancer Neg Hx   . Stomach cancer Neg Hx     Social History Social History   Tobacco Use  . Smoking status: Current Every Day Smoker  . Smokeless tobacco: Never Used  . Tobacco comment: sometimes  Vaping Use  . Vaping Use: Never used  Substance Use Topics  . Alcohol use: No  . Drug use: No     Allergies   Patient has no known allergies.   Review of Systems Review of Systems  Constitutional: Positive for activity change. Negative for appetite change, fatigue and fever.  Respiratory: Negative for cough and shortness of breath.   Cardiovascular: Negative for chest pain and leg swelling.  Gastrointestinal: Negative for abdominal pain, diarrhea, nausea and vomiting.  Musculoskeletal: Positive for arthralgias (improved). Negative for myalgias.  Skin: Positive  for rash.  Neurological: Negative for dizziness, weakness, light-headedness, numbness and headaches.     Physical Exam Triage Vital Signs ED Triage Vitals  Enc Vitals Group     BP 11/27/20 1714 (!) 118/96     Pulse Rate 11/27/20 1714 63     Resp 11/27/20 1714 17     Temp 11/27/20 1714 99 F (37.2 C)     Temp Source 11/27/20 1714 Oral     SpO2 11/27/20 1714 100 %     Weight --      Height --      Head Circumference --      Peak Flow --      Pain Score  11/27/20 1713 0     Pain Loc --      Pain Edu? --      Excl. in GC? --    No data found.  Updated Vital Signs BP (!) 118/96   Pulse 63   Temp 99 F (37.2 C) (Oral)   Resp 17   SpO2 100%   Visual Acuity Right Eye Distance:   Left Eye Distance:   Bilateral Distance:    Right Eye Near:   Left Eye Near:    Bilateral Near:     Physical Exam Vitals reviewed.  Constitutional:      General: He is awake.     Appearance: Normal appearance. He is normal weight. He is not ill-appearing.     Comments: Very pleasant male appears stated age in no acute distress  HENT:     Head: Normocephalic and atraumatic.  Cardiovascular:     Rate and Rhythm: Normal rate and regular rhythm.     Pulses:          Posterior tibial pulses are 2+ on the right side and 2+ on the left side.     Heart sounds: No murmur heard.   Pulmonary:     Effort: Pulmonary effort is normal.     Breath sounds: Normal breath sounds. No stridor. No wheezing, rhonchi or rales.     Comments: Clear to auscultation bilaterally Abdominal:     General: Bowel sounds are normal.     Palpations: Abdomen is soft.     Tenderness: There is no abdominal tenderness.  Musculoskeletal:     Right lower leg: No edema.     Left lower leg: No edema.     Left ankle: No swelling or deformity. No tenderness. Normal range of motion.  Skin:    Findings: Rash present. Rash is macular and papular.     Comments: Maculopapular rash with evidence of excoriation noted medial right foot.  No bleeding or drainage noted.  No vesicles or wounds noted.  Neurological:     Mental Status: He is alert.  Psychiatric:        Behavior: Behavior is cooperative.      UC Treatments / Results  Labs (all labs ordered are listed, but only abnormal results are displayed) Labs Reviewed - No data to display  EKG   Radiology No results found.  Procedures Procedures (including critical care time)  Medications Ordered in UC Medications - No data  to display  Initial Impression / Assessment and Plan / UC Course  I have reviewed the triage vital signs and the nursing notes.  Pertinent labs & imaging results that were available during my care of the patient were reviewed by me and considered in my medical decision making (see chart for details).  Patient's had significant improvement of ankle/foot pain with Mobic.  He was provided refill as requested.  Discussed that it is too early to repeat imaging and this would need to be done in approximately 2 months.  Discussed the importance of following up with PCP and he was given contact information for local family medicine practice to call and schedule an appointment.  Discussed that he is up-to-date additional NSAIDs with Mobic.  Encouraged him to wear supportive footwear.  Strict return precautions given to which patient expressed understanding.  Patient started on desonide twice daily to help manage symptoms.  Recommend he alternate antihistamines to manage pruritus.  Discussed the importance of using hypoallergenic soaps and detergents.  Strict return precautions given to which patient expressed understanding.  Final Clinical Impressions(s) / UC Diagnoses   Final diagnoses:  Chronic pain of left ankle  Pruritic rash     Discharge Instructions     I have called in a refill of your Mobic to help with your pain.  Please call [facility] to schedule a new patient appointment.  You will need an x-ray to monitor that area we noticed in approximately 2 months.  I have also called in a cream to help with the itching.  You can alternate famotidine and cetirizine for additional symptom relief.  Make sure you are moisturizing the area.  If anything worsens please return for reevaluation.    ED Prescriptions    Medication Sig Dispense Auth. Provider   meloxicam (MOBIC) 15 MG tablet Take 1 tablet (15 mg total) by mouth daily. 15 tablet Wiktoria Hemrick K, PA-C   desonide (DESOWEN)  0.05 % cream Apply topically 2 (two) times daily. 30 g Eydan Chianese, Noberto Retort, PA-C     PDMP not reviewed this encounter.   Jeani Hawking, PA-C 11/27/20 1737

## 2021-01-29 ENCOUNTER — Ambulatory Visit (HOSPITAL_COMMUNITY)
Admission: RE | Admit: 2021-01-29 | Discharge: 2021-01-29 | Disposition: A | Discharge status: Home / Self Care | Payer: Commercial Managed Care - PPO | Source: Ambulatory Visit | Attending: Emergency Medicine | Admitting: Emergency Medicine

## 2021-01-29 ENCOUNTER — Other Ambulatory Visit: Payer: Self-pay

## 2021-01-29 ENCOUNTER — Encounter (HOSPITAL_COMMUNITY): Payer: Self-pay

## 2021-01-29 ENCOUNTER — Ambulatory Visit (INDEPENDENT_AMBULATORY_CARE_PROVIDER_SITE_OTHER): Payer: Commercial Managed Care - PPO

## 2021-01-29 VITALS — BP 118/79 | HR 75 | Temp 98.0°F | Resp 17

## 2021-01-29 DIAGNOSIS — M19072 Primary osteoarthritis, left ankle and foot: Secondary | ICD-10-CM

## 2021-01-29 DIAGNOSIS — M25572 Pain in left ankle and joints of left foot: Secondary | ICD-10-CM | POA: Diagnosis not present

## 2021-01-29 MED ORDER — MELOXICAM 15 MG PO TABS: 15.0000 mg | ORAL_TABLET | Freq: Every day | ORAL | 0 refills | Status: AC

## 2021-01-29 NOTE — ED Triage Notes (Signed)
Pt presents for follow up for foot/ankle pain X 2 months; pt states pain is much better.

## 2021-01-29 NOTE — ED Provider Notes (Signed)
[facility]    CSN: [id number] Arrival date & time: 01/29/21  1554      History   Chief Complaint Chief Complaint  Patient presents with   APPOINTMENT: Follow Up     HPI Jake Oconnell is a 55 y.o. male.   Patient here for re-evaluation of left ankle pain.  Patient was evaluated in May and there was a sclerotic area of the ankle and recommend a 3 month follow up xray to determine stability.  Reports ankle pain has improved but still with intermittent pain.  Reports mobic helps with pain.  Denies any trauma, injury, or other precipitating event.  Denies any fevers, chest pain, shortness of breath, N/V/D, numbness, tingling, weakness, abdominal pain, or headaches.     The history is provided by the patient.   History reviewed. No pertinent past medical history.  There are no problems to display for this patient.   History reviewed. No pertinent surgical history.     Home Medications    Prior to Admission medications   Medication Sig Start Date End Date Taking? Authorizing Provider  desonide (DESOWEN) 0.05 % cream Apply topically 2 (two) times daily. 11/27/20   Raspet, Noberto Retort, PA-C  hydrocortisone (ANUSOL-HC) 25 MG suppository Place 1 suppository (25 mg total) rectally 2 (two) times daily. 01/27/20   Georgetta Haber, NP  hydrOXYzine (ATARAX/VISTARIL) 25 MG tablet Take 1-2 tablets (25-50 mg total) by mouth every 8 (eight) hours as needed for itching. 12/07/17   Wallis Bamberg, PA-C  meloxicam (MOBIC) 15 MG tablet Take 1 tablet (15 mg total) by mouth daily. 01/29/21   Ivette Loyal, NP    Family History Family History  Problem Relation Age of Onset   Colon cancer Neg Hx    Colon polyps Neg Hx    Esophageal cancer Neg Hx    Rectal cancer Neg Hx    Stomach cancer Neg Hx     Social History Social History   Tobacco Use   Smoking status: Every Day   Smokeless tobacco: Never   Tobacco comments:    sometimes  Vaping Use   Vaping Use: Never used  Substance Use  Topics   Alcohol use: No   Drug use: No     Allergies   Patient has no known allergies.   Review of Systems Review of Systems  Musculoskeletal:  Positive for arthralgias and joint swelling.  All other systems reviewed and are negative.   Physical Exam Triage Vital Signs ED Triage Vitals  Enc Vitals Group     BP 01/29/21 1627 118/79     Pulse Rate 01/29/21 1627 75     Resp 01/29/21 1627 17     Temp 01/29/21 1627 98 F (36.7 C)     Temp Source 01/29/21 1627 Oral     SpO2 01/29/21 1627 99 %     Weight --      Height --      Head Circumference --      Peak Flow --      Pain Score 01/29/21 1629 5     Pain Loc --      Pain Edu? --      Excl. in GC? --    No data found.  Updated Vital Signs BP 118/79 (BP Location: Left Arm)   Pulse 75   Temp 98 F (36.7 C) (Oral)   Resp 17   SpO2 99%   Visual Acuity Right Eye Distance:   Left Eye Distance:  Bilateral Distance:    Right Eye Near:   Left Eye Near:    Bilateral Near:     Physical Exam Vitals and nursing note reviewed.  Constitutional:      General: He is not in acute distress.    Appearance: Normal appearance. He is not ill-appearing, toxic-appearing or diaphoretic.  HENT:     Head: Normocephalic and atraumatic.  Eyes:     Conjunctiva/sclera: Conjunctivae normal.  Cardiovascular:     Rate and Rhythm: Normal rate.     Pulses: Normal pulses.  Pulmonary:     Effort: Pulmonary effort is normal.  Abdominal:     General: Abdomen is flat.  Musculoskeletal:        General: Normal range of motion.     Cervical back: Normal range of motion.     Left ankle: Swelling present. No deformity. Tenderness present. Normal range of motion. Anterior drawer test negative. Normal pulse.     Left Achilles Tendon: Normal.  Skin:    General: Skin is warm and dry.  Neurological:     General: No focal deficit present.     Mental Status: He is alert and oriented to person, place, and time.  Psychiatric:        Mood and  Affect: Mood normal.     UC Treatments / Results  Labs (all labs ordered are listed, but only abnormal results are displayed) Labs Reviewed - No data to display  EKG   Radiology DG Ankle Complete Left  Result Date: 01/29/2021 CLINICAL DATA:  Ankle pain EXAM: LEFT ANKLE COMPLETE - 3+ VIEW COMPARISON:  Ankle radiographs 11/05/2020 FINDINGS: No evidence of acute fracture or dislocation. There is moderate tibiotalar osteoarthritis. Subchondral lucency with a sclerotic border and narrow zone of transition is unchanged appearance in comparison to prior exam in May. Unchanged ossification just below the medial malleolus, compatible with old injury. IMPRESSION: Moderate tibiotalar osteoarthritis with evidence of old medial malleolar injury. 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 Electronically Signed   By: Caprice Renshaw   On: 01/29/2021 17:13    Procedures Procedures (including critical care time)  Medications Ordered in UC Medications - No data to display  Initial Impression / Assessment and Plan / UC Course  I have reviewed the triage vital signs and the nursing notes.  Pertinent labs & imaging results that were available during my care of the patient were reviewed by me and considered in my medical decision making (see chart for details).    Assessment negative for red flags or concerns.  Osteoarthritis of left ankle. Xray show sclerotic area is unchanged.  Refill of mobic given but explained that he will need to get established and follow up with a primary care provider for any future refills.  PCP assistance started.  Final Clinical Impressions(s) / UC Diagnoses   Final diagnoses:  Osteoarthritis of left ankle, unspecified osteoarthritis type     Discharge Instructions      You can continue to take the mobic as needed for pain.   It will be very important for you to get established and follow up with a  primary care provider for any future refills and for longterm management of ankle pain.  A request has been put in for someone to contact you to help with this.      ED Prescriptions     Medication Sig Dispense Auth. Provider   meloxicam (MOBIC) 15 MG tablet  Take 1 tablet (15 mg total) by mouth daily. 15 tablet Ivette Loyal, NP      PDMP not reviewed this encounter.   Ivette Loyal, NP 01/29/21 1729

## 2021-01-29 NOTE — Discharge Instructions (Addendum)
You can continue to take the mobic as needed for pain.   It will be very important for you to get established and follow up with a primary care provider for any future refills and for longterm management of ankle pain.  A request has been put in for someone to contact you to help with this.

## 2021-04-07 ENCOUNTER — Other Ambulatory Visit: Payer: Self-pay | Admitting: Sports Medicine

## 2021-04-07 ENCOUNTER — Ambulatory Visit
Admission: RE | Admit: 2021-04-07 | Discharge: 2021-04-07 | Disposition: A | Discharge status: Home / Self Care | Payer: Commercial Managed Care - PPO | Source: Ambulatory Visit | Attending: Sports Medicine | Admitting: Sports Medicine

## 2021-04-07 DIAGNOSIS — M25572 Pain in left ankle and joints of left foot: Secondary | ICD-10-CM

## 2022-02-12 IMAGING — DX DG ANKLE COMPLETE 3+V*L*
3 series · 3 of 3 positions shown · non-contrast
Comparison: Ankle radiographs 11/05/2020

CLINICAL DATA: Ankle pain

EXAM:
LEFT ANKLE COMPLETE - 3+ VIEW

[ankle ap]
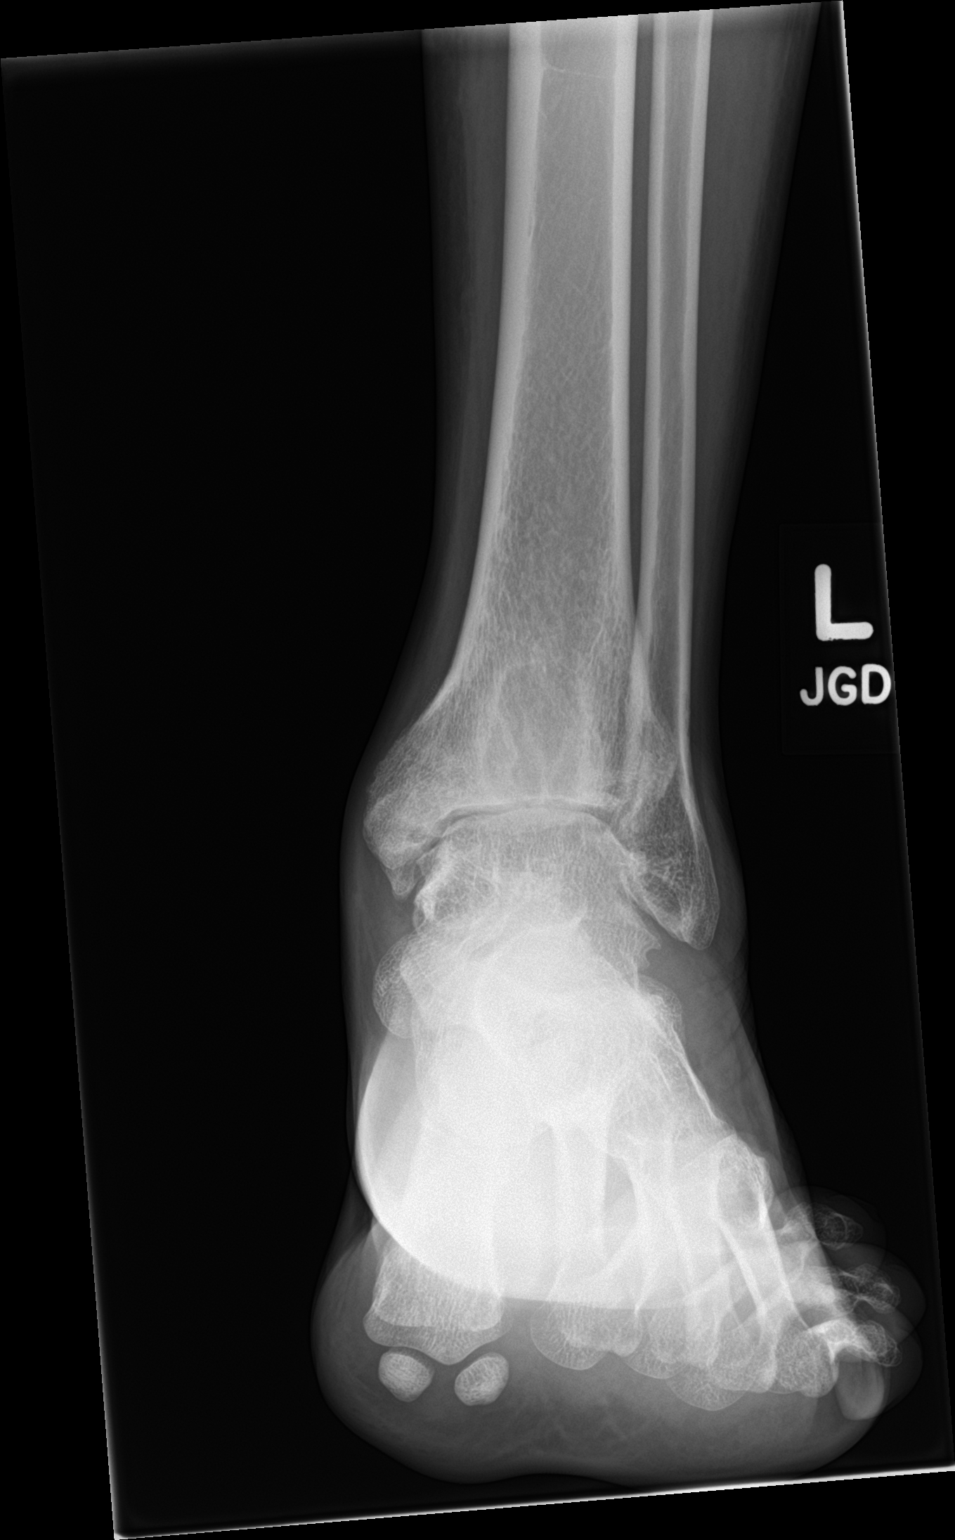

[ankle obl]
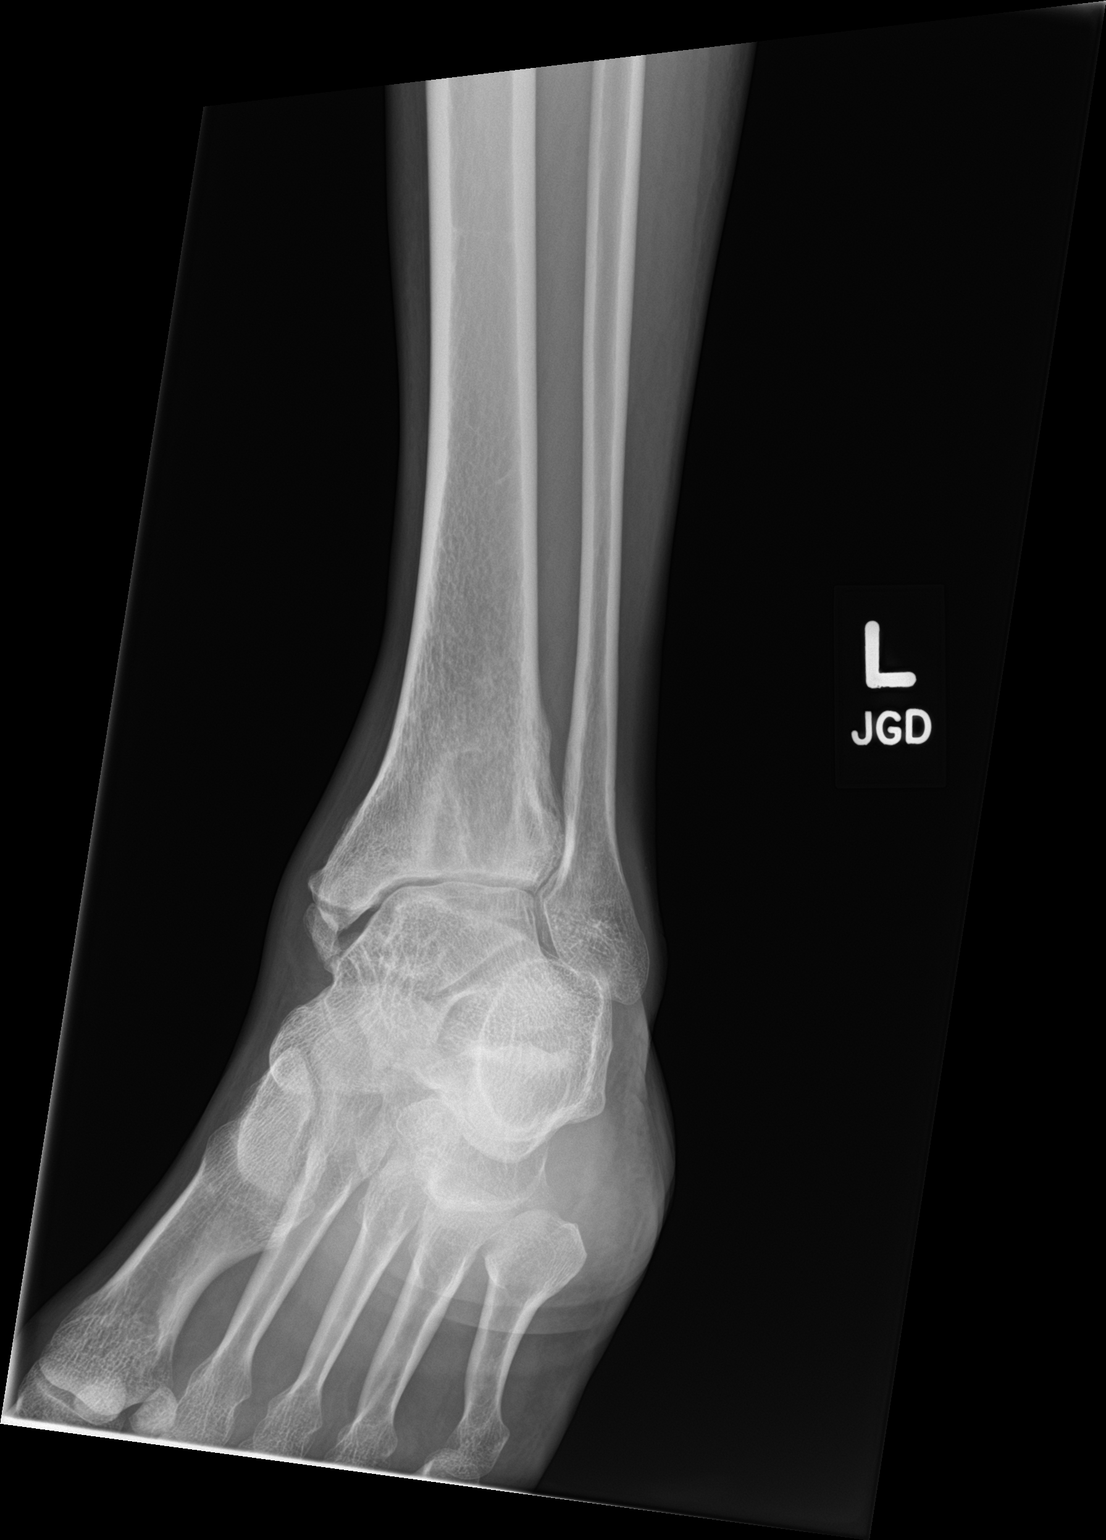

[ankle lat]
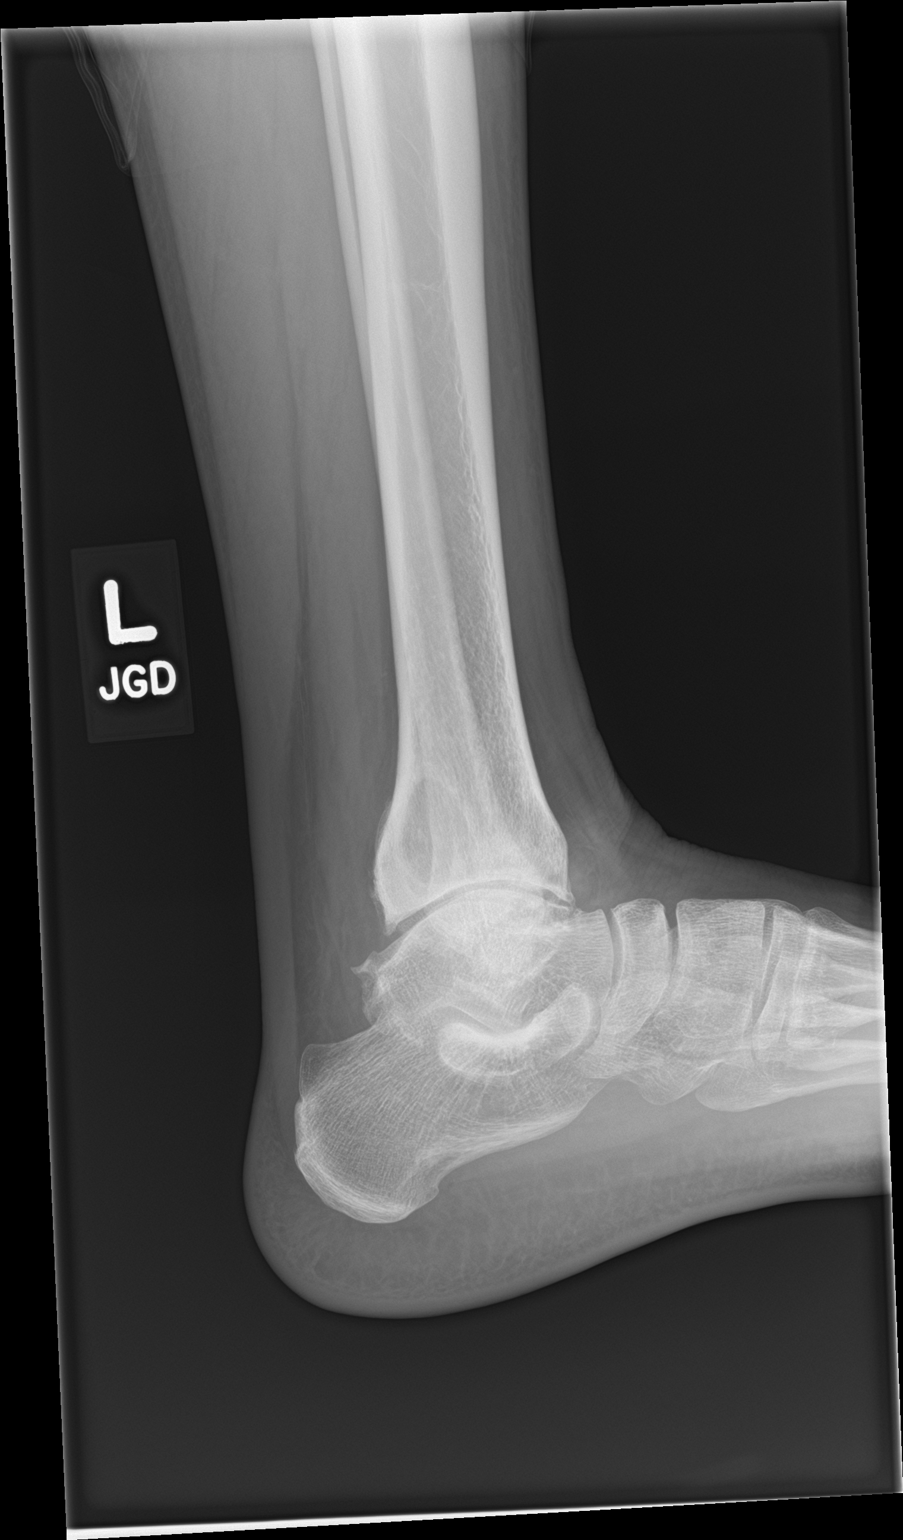

[3 of 3 positions shown; findings below may reference images not displayed]

FINDINGS: No evidence of acute fracture or dislocation. There is moderate
tibiotalar osteoarthritis. Subchondral lucency with a sclerotic
border and narrow zone of transition is unchanged appearance in
comparison to prior exam [REDACTED]. Unchanged ossification just below
the medial malleolus, compatible with old injury.
IMPRESSION: Moderate tibiotalar osteoarthritis with evidence of old medial
malleolar injury.

Subchondral lucent lesion with a sclerotic border is unchanged and
favored to represent a large subchondral cyst.

## 2022-04-21 IMAGING — DX DG ANKLE COMPLETE 3+V*L*
3 series · 3 of 3 positions shown · non-contrast
Comparison: 01/29/2021.  11/05/2020.

CLINICAL DATA: Left ankle pain.  No known injury.

EXAM:
LEFT ANKLE COMPLETE - 3+ VIEW

[dg ankle complete left (1 of 3)]
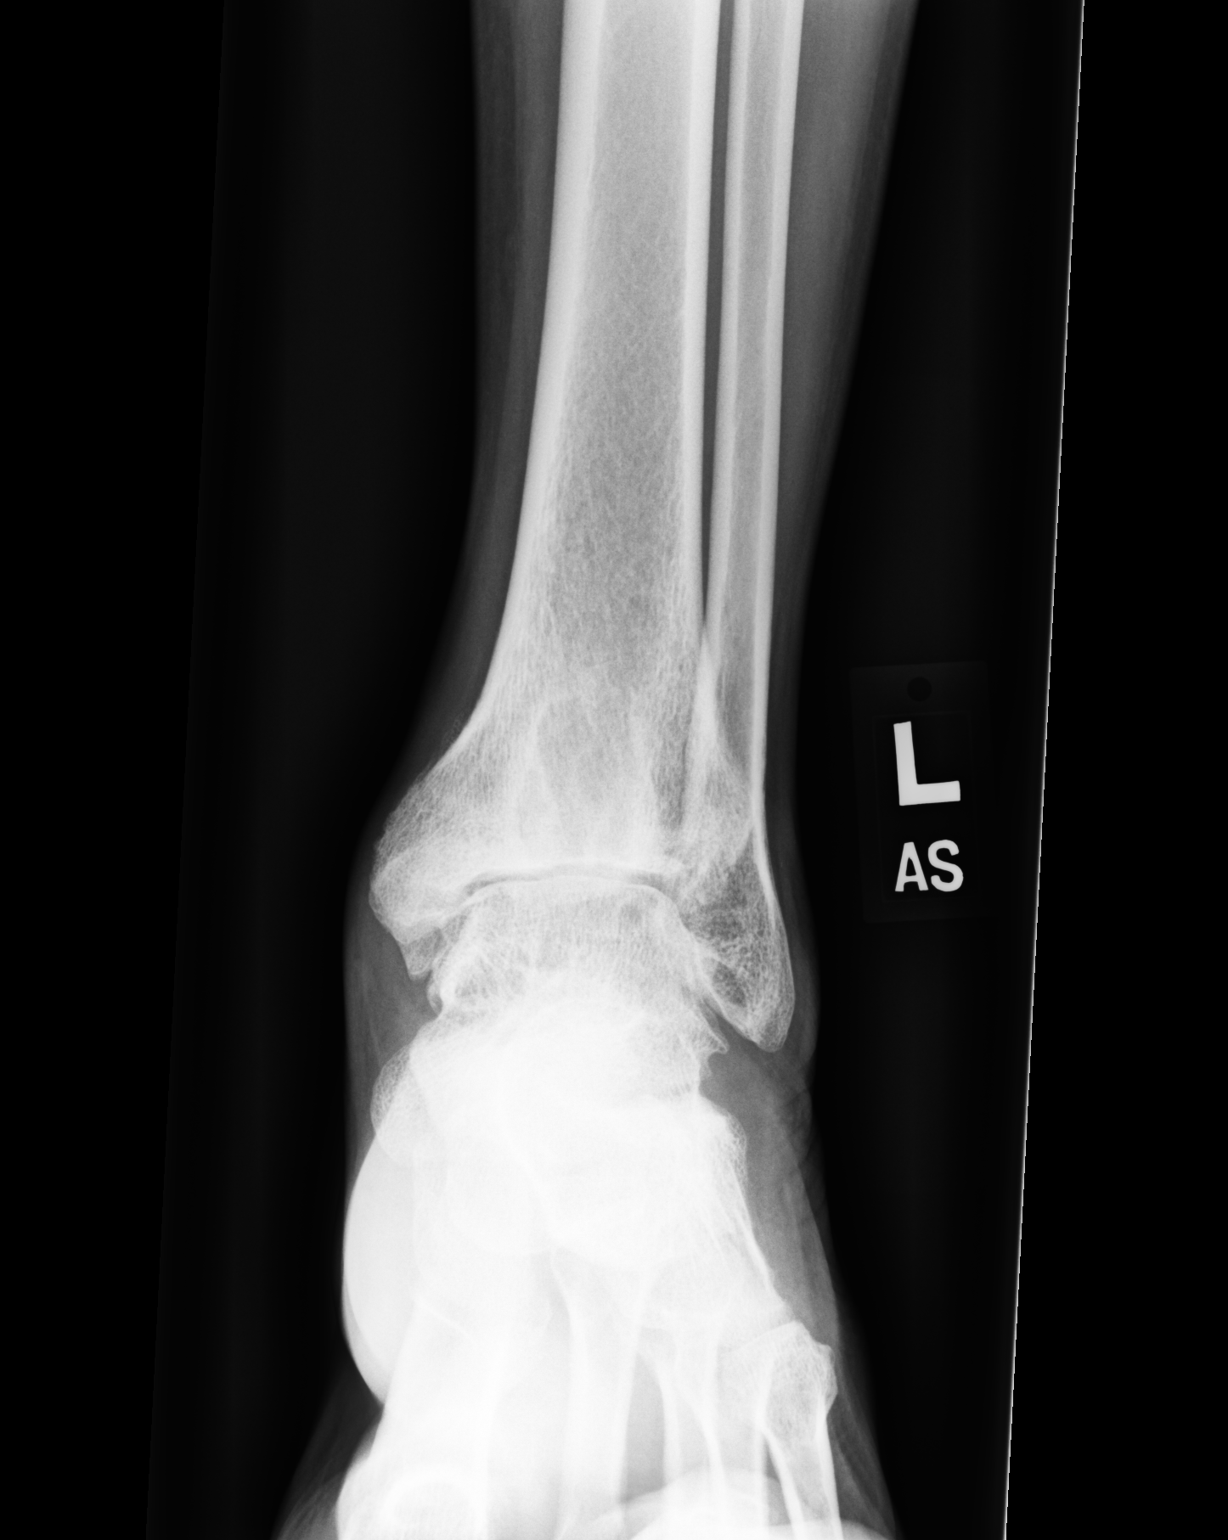

[dg ankle complete left (2 of 3)]
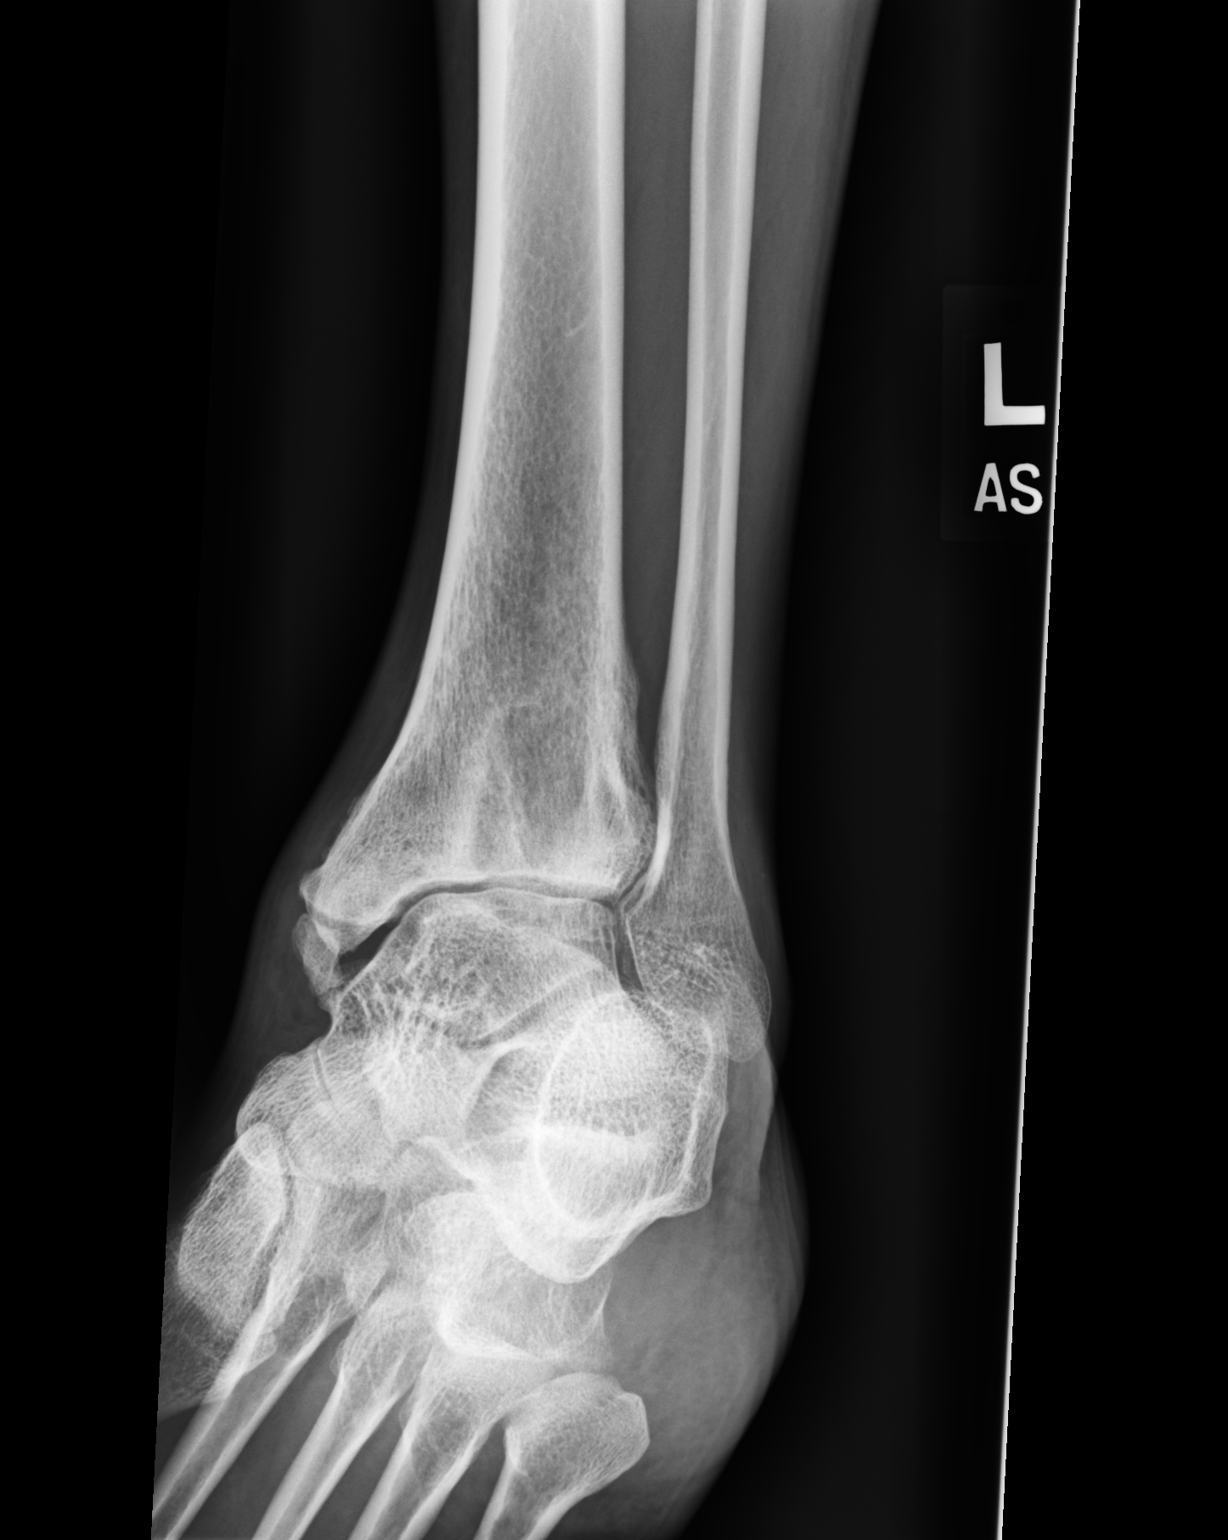

[dg ankle complete left (3 of 3)]
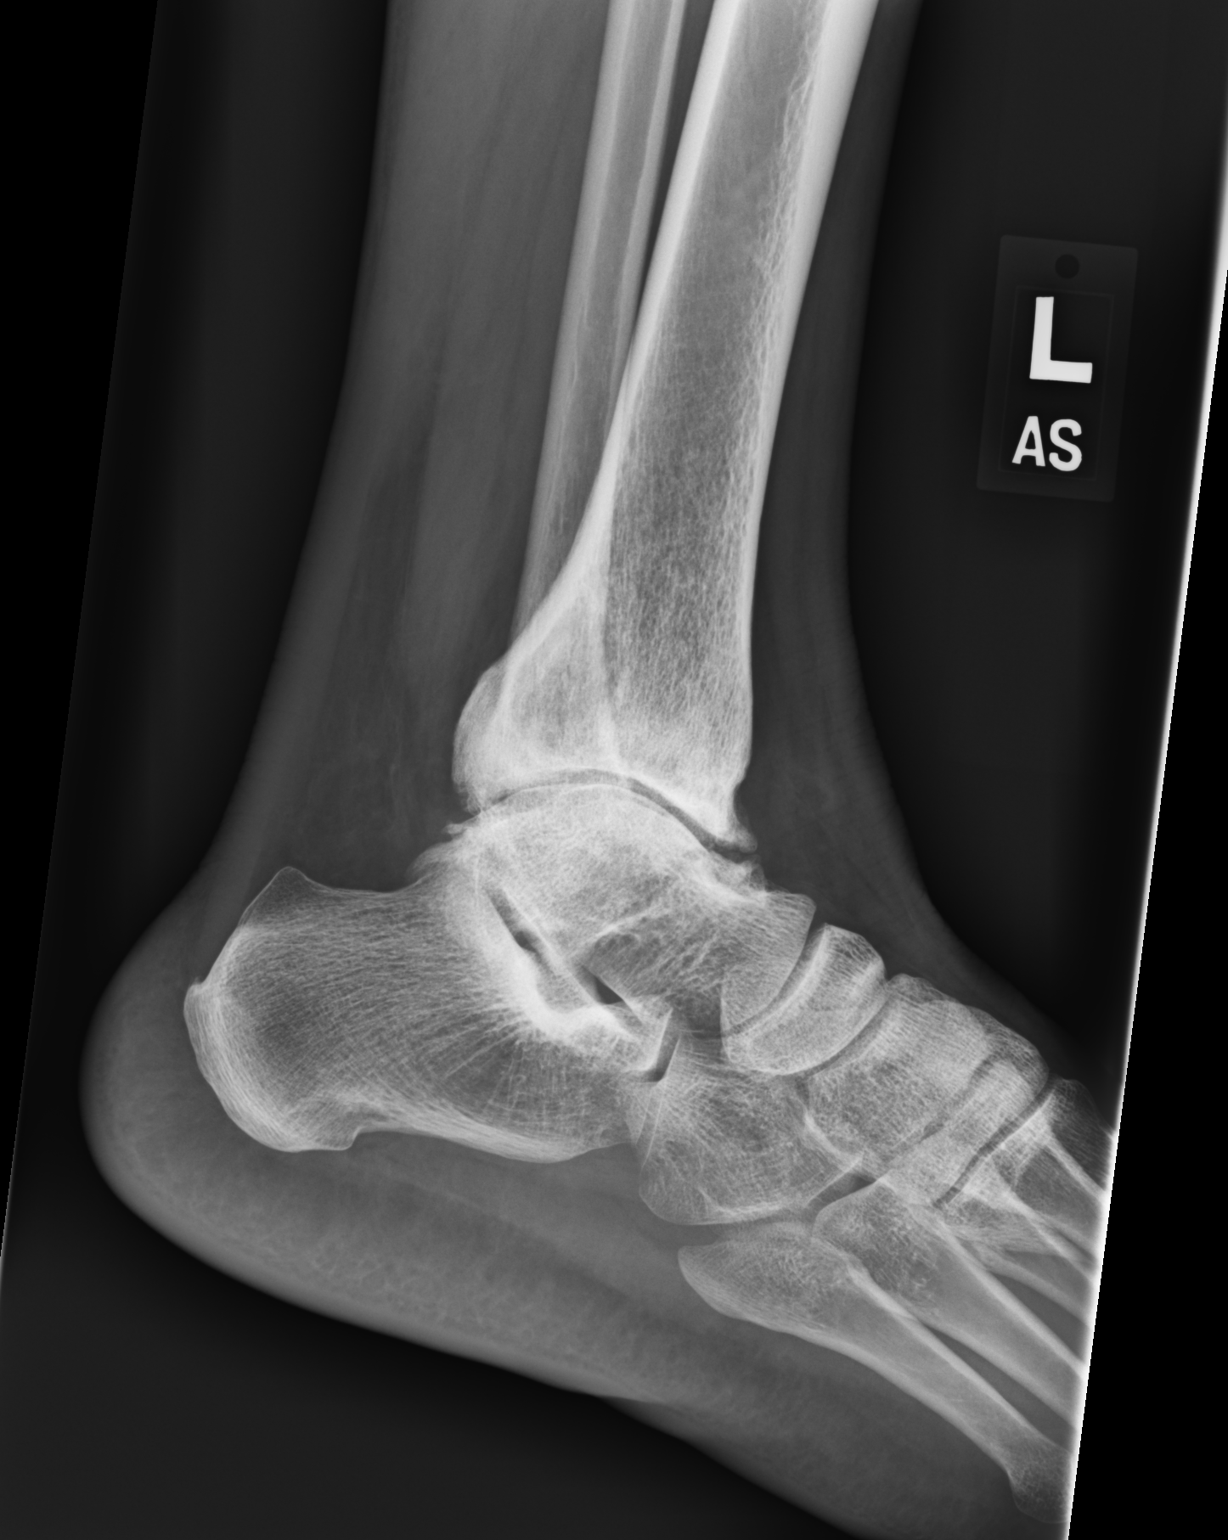

[3 of 3 positions shown; findings below may reference images not displayed]

FINDINGS: Old medial malleolar fracture fragments again noted. Diffuse severe
degenerative changes about the tibiotalar joint space again noted.
Prominent subchondral cyst with sclerotic borders again noted in the
distal tibia. This appears unchanged from prior studies of
01/29/2021 and 11/05/2020. No radiopaque foreign body.
IMPRESSION: 1. Old medial malleolar fracture fragments again noted. No acute
abnormality identified.

2. Diffuse severe degenerative changes about the tibiotalar joint
space again noted. Prominent subchondral cyst with sclerotic borders
again noted in the distal tibia. This appears unchanged from prior
studies of 01/29/2021 and 11/05/2020.

## 2022-05-26 DIAGNOSIS — Z0000 Encounter for general adult medical examination without abnormal findings: Secondary | ICD-10-CM | POA: Diagnosis not present

## 2022-05-26 DIAGNOSIS — Z13220 Encounter for screening for lipoid disorders: Secondary | ICD-10-CM | POA: Diagnosis not present

## 2022-05-26 DIAGNOSIS — D649 Anemia, unspecified: Secondary | ICD-10-CM | POA: Diagnosis not present

## 2022-05-26 DIAGNOSIS — M79672 Pain in left foot: Secondary | ICD-10-CM | POA: Diagnosis not present

## 2022-05-26 DIAGNOSIS — Z125 Encounter for screening for malignant neoplasm of prostate: Secondary | ICD-10-CM | POA: Diagnosis not present

## 2022-06-17 ENCOUNTER — Ambulatory Visit: Payer: Commercial Managed Care - PPO | Admitting: Podiatry

## 2023-05-28 DIAGNOSIS — Z0000 Encounter for general adult medical examination without abnormal findings: Secondary | ICD-10-CM | POA: Diagnosis not present

## 2023-05-28 DIAGNOSIS — L309 Dermatitis, unspecified: Secondary | ICD-10-CM | POA: Diagnosis not present

## 2023-05-28 DIAGNOSIS — D649 Anemia, unspecified: Secondary | ICD-10-CM | POA: Diagnosis not present

## 2023-05-28 DIAGNOSIS — Z23 Encounter for immunization: Secondary | ICD-10-CM | POA: Diagnosis not present

## 2023-05-28 DIAGNOSIS — Z125 Encounter for screening for malignant neoplasm of prostate: Secondary | ICD-10-CM | POA: Diagnosis not present

## 2023-06-28 DIAGNOSIS — R946 Abnormal results of thyroid function studies: Secondary | ICD-10-CM | POA: Diagnosis not present

## 2023-06-28 DIAGNOSIS — R7989 Other specified abnormal findings of blood chemistry: Secondary | ICD-10-CM | POA: Diagnosis not present
